# Patient Record
Sex: Female | Born: 1950
Health system: Southern US, Community
[De-identification: ages and names within clinical notes are randomized; demographics above are authoritative.]

## PROBLEM LIST (undated history)

## (undated) DIAGNOSIS — N289 Disorder of kidney and ureter, unspecified: Secondary | ICD-10-CM

## (undated) DIAGNOSIS — E119 Type 2 diabetes mellitus without complications: Secondary | ICD-10-CM

## (undated) DIAGNOSIS — I10 Essential (primary) hypertension: Secondary | ICD-10-CM

## (undated) HISTORY — PX: VAGINAL HYSTERECTOMY: SUR661

## (undated) HISTORY — PX: RENAL ARTERY ANGIOPLASTY: SHX2317

## (undated) HISTORY — PX: COLONOSCOPY: SHX174

## (undated) HISTORY — DX: Type 2 diabetes mellitus without complications: E11.9

---

## 2002-05-24 ENCOUNTER — Ambulatory Visit (HOSPITAL_COMMUNITY): Admission: RE | Admit: 2002-05-24 | Discharge: 2002-05-24 | Payer: Self-pay | Admitting: Family Medicine

## 2002-05-24 ENCOUNTER — Encounter: Payer: Self-pay | Admitting: Family Medicine

## 2006-12-25 ENCOUNTER — Encounter: Admission: RE | Admit: 2006-12-25 | Discharge: 2006-12-25 | Payer: Self-pay | Admitting: Cardiology

## 2006-12-29 ENCOUNTER — Ambulatory Visit (HOSPITAL_COMMUNITY): Admission: RE | Admit: 2006-12-29 | Discharge: 2006-12-29 | Payer: Self-pay | Admitting: Cardiology

## 2008-11-29 ENCOUNTER — Ambulatory Visit (HOSPITAL_COMMUNITY): Admission: RE | Admit: 2008-11-29 | Discharge: 2008-11-29 | Payer: Self-pay | Admitting: Family Medicine

## 2008-12-05 ENCOUNTER — Encounter: Admission: RE | Admit: 2008-12-05 | Discharge: 2008-12-05 | Payer: Self-pay | Admitting: Family Medicine

## 2009-05-01 ENCOUNTER — Other Ambulatory Visit: Admission: RE | Admit: 2009-05-01 | Discharge: 2009-05-01 | Payer: Self-pay | Admitting: Family Medicine

## 2009-07-26 ENCOUNTER — Encounter: Admission: RE | Admit: 2009-07-26 | Discharge: 2009-07-26 | Payer: Self-pay

## 2010-02-05 ENCOUNTER — Encounter
Admission: RE | Admit: 2010-02-05 | Discharge: 2010-02-05 | Payer: Self-pay | Source: Home / Self Care | Attending: Family Medicine | Admitting: Family Medicine

## 2010-03-17 ENCOUNTER — Encounter: Payer: Self-pay | Admitting: Obstetrics & Gynecology

## 2010-03-19 ENCOUNTER — Encounter: Payer: Self-pay | Admitting: Family Medicine

## 2010-07-10 NOTE — Cardiovascular Report (Signed)
Nicole Best, [place]              ACCOUNT NO.:  [id number]   MEDICAL RECORD NO.:  [id number]          PATIENT TYPE:  AMB   LOCATION:  SDS                          FACILITY:  [facility]   PHYSICIAN:  Vonna Kotyk R. Jacinto Halim, MD       DATE OF BIRTH:  07-Aug-1950   DATE OF PROCEDURE:  12/29/2006  DATE OF DISCHARGE:                            CARDIAC CATHETERIZATION   PROCEDURE PERFORMED:  1. Abdominal aortogram.  2. Selective right and left renal arteriography.   INDICATIONS:  Ms. [name] is a 60 year old female with difficult-to-  control hypertension who had an MRA of the renal arteries sometime in  2004 or 2005 and was found to have mild fibromuscular dysplasia.  She  came to see me about a week ago with a diastolic blood pressure of 140  mmHg.  Given the significant elevated blood pressure, she was brought to  the catheterization lab to evaluate her renal anatomy for possible  balloon angioplasty.   ANGIOGRAPHIC DATA:  There were two renal arteries one on either side.  The right renal artery appeared to be tortuous.   Selective right renal arteriography revealed a tortuous superior pole  branch of the right renal artery without any evidence of fibromuscular  dysplasia.  The renal artery was widely patent.   The left renal artery was also widely patent without any evidence of  renal artery stenosis.  There was excellent blush of parenchyma .   IMPRESSION:  No evidence of renal artery stenosis.   RECOMMENDATIONS:  The patient appears to have stabilized with the  present medical therapy, and her diastolic blood pressure was 94 mmHg  during the procedure.  We will continue to titrate the blood pressure  medications.   A total of 80 cc of contrast was utilized for diagnostic angiography.   PROCEDURE:  Under usual sterile precautions using a 6-French right  femoral arterial access, a 5-French short LIMA catheter was advanced to  the abdominal aorta and abdominal aortogram was performed.  The  catheter  was then utilized to engage selectively the right renal artery and then  the left renal artery and angiography was  performed.  The catheter was then pulled out of the body and right  femoral arteriography was performed through the arterial access sheath  and the access closed with StarClose with excellent hemostasis.  The  patient tolerated the procedure well.  No immediate complications noted.      Cristy Hilts. Jacinto Halim, MD  Electronically Signed     JRG/MEDQ  D:  12/29/2006  T:  12/30/2006  Job:  [id number]   cc:   Vikki Ports, M.D.

## 2011-02-15 ENCOUNTER — Other Ambulatory Visit (HOSPITAL_COMMUNITY): Payer: Self-pay | Admitting: Family Medicine

## 2011-02-15 DIAGNOSIS — Z1231 Encounter for screening mammogram for malignant neoplasm of breast: Secondary | ICD-10-CM

## 2011-03-19 ENCOUNTER — Other Ambulatory Visit (HOSPITAL_COMMUNITY): Payer: Self-pay | Admitting: Family Medicine

## 2011-03-21 ENCOUNTER — Ambulatory Visit (HOSPITAL_COMMUNITY): Payer: Self-pay

## 2011-04-10 ENCOUNTER — Ambulatory Visit
Admission: RE | Admit: 2011-04-10 | Discharge: 2011-04-10 | Disposition: A | Discharge status: Home / Self Care | Payer: BC Managed Care – PPO | Source: Ambulatory Visit | Attending: Family Medicine | Admitting: Family Medicine

## 2011-04-10 DIAGNOSIS — Z1231 Encounter for screening mammogram for malignant neoplasm of breast: Secondary | ICD-10-CM

## 2012-02-06 ENCOUNTER — Other Ambulatory Visit: Payer: Self-pay | Admitting: Family Medicine

## 2012-02-06 DIAGNOSIS — Z1231 Encounter for screening mammogram for malignant neoplasm of breast: Secondary | ICD-10-CM

## 2012-04-14 ENCOUNTER — Ambulatory Visit: Payer: BC Managed Care – PPO

## 2012-05-12 ENCOUNTER — Ambulatory Visit: Payer: BC Managed Care – PPO

## 2012-06-11 ENCOUNTER — Ambulatory Visit
Admission: RE | Admit: 2012-06-11 | Discharge: 2012-06-11 | Disposition: A | Discharge status: Home / Self Care | Payer: BC Managed Care – PPO | Source: Ambulatory Visit | Attending: Family Medicine | Admitting: Family Medicine

## 2012-06-11 DIAGNOSIS — Z1231 Encounter for screening mammogram for malignant neoplasm of breast: Secondary | ICD-10-CM

## 2012-07-28 ENCOUNTER — Other Ambulatory Visit (HOSPITAL_COMMUNITY)
Admission: RE | Admit: 2012-07-28 | Discharge: 2012-07-28 | Disposition: A | Discharge status: Home / Self Care | Payer: BC Managed Care – PPO | Source: Ambulatory Visit | Attending: Family Medicine | Admitting: Family Medicine

## 2012-07-28 ENCOUNTER — Other Ambulatory Visit: Payer: Self-pay | Admitting: Family Medicine

## 2012-07-28 DIAGNOSIS — Z124 Encounter for screening for malignant neoplasm of cervix: Secondary | ICD-10-CM | POA: Insufficient documentation

## 2013-03-24 ENCOUNTER — Other Ambulatory Visit: Payer: Self-pay

## 2013-03-24 DIAGNOSIS — Z1231 Encounter for screening mammogram for malignant neoplasm of breast: Secondary | ICD-10-CM

## 2013-06-15 ENCOUNTER — Ambulatory Visit: Payer: BC Managed Care – PPO

## 2013-07-08 ENCOUNTER — Ambulatory Visit: Payer: BC Managed Care – PPO

## 2013-07-13 ENCOUNTER — Ambulatory Visit: Payer: BC Managed Care – PPO

## 2013-07-15 ENCOUNTER — Ambulatory Visit
Admission: RE | Admit: 2013-07-15 | Discharge: 2013-07-15 | Disposition: A | Discharge status: Home / Self Care | Payer: 59 | Source: Ambulatory Visit

## 2013-07-15 DIAGNOSIS — Z1231 Encounter for screening mammogram for malignant neoplasm of breast: Secondary | ICD-10-CM

## 2014-02-28 ENCOUNTER — Emergency Department (HOSPITAL_COMMUNITY): Payer: Commercial Managed Care - PPO

## 2014-02-28 ENCOUNTER — Inpatient Hospital Stay (HOSPITAL_COMMUNITY)
Admission: EM | Admit: 2014-02-28 | Discharge: 2014-03-06 | DRG: 304 | Disposition: A | Discharge status: Home / Self Care | Payer: Commercial Managed Care - PPO | Attending: Internal Medicine | Admitting: Internal Medicine

## 2014-02-28 ENCOUNTER — Encounter (HOSPITAL_COMMUNITY): Payer: Self-pay | Admitting: Emergency Medicine

## 2014-02-28 DIAGNOSIS — Z8673 Personal history of transient ischemic attack (TIA), and cerebral infarction without residual deficits: Secondary | ICD-10-CM

## 2014-02-28 DIAGNOSIS — I161 Hypertensive emergency: Secondary | ICD-10-CM | POA: Diagnosis present

## 2014-02-28 DIAGNOSIS — F17210 Nicotine dependence, cigarettes, uncomplicated: Secondary | ICD-10-CM | POA: Diagnosis present

## 2014-02-28 DIAGNOSIS — Z7982 Long term (current) use of aspirin: Secondary | ICD-10-CM

## 2014-02-28 DIAGNOSIS — Z79899 Other long term (current) drug therapy: Secondary | ICD-10-CM | POA: Diagnosis not present

## 2014-02-28 DIAGNOSIS — I10 Essential (primary) hypertension: Principal | ICD-10-CM

## 2014-02-28 DIAGNOSIS — I160 Hypertensive urgency: Secondary | ICD-10-CM

## 2014-02-28 DIAGNOSIS — H538 Other visual disturbances: Secondary | ICD-10-CM | POA: Diagnosis present

## 2014-02-28 DIAGNOSIS — I672 Cerebral atherosclerosis: Secondary | ICD-10-CM | POA: Diagnosis present

## 2014-02-28 DIAGNOSIS — R42 Dizziness and giddiness: Secondary | ICD-10-CM

## 2014-02-28 DIAGNOSIS — E871 Hypo-osmolality and hyponatremia: Secondary | ICD-10-CM | POA: Diagnosis present

## 2014-02-28 DIAGNOSIS — I7771 Dissection of carotid artery: Secondary | ICD-10-CM | POA: Diagnosis present

## 2014-02-28 DIAGNOSIS — Z90710 Acquired absence of both cervix and uterus: Secondary | ICD-10-CM | POA: Diagnosis not present

## 2014-02-28 DIAGNOSIS — I701 Atherosclerosis of renal artery: Secondary | ICD-10-CM

## 2014-02-28 HISTORY — DX: Disorder of kidney and ureter, unspecified: N28.9

## 2014-02-28 HISTORY — DX: Essential (primary) hypertension: I10

## 2014-02-28 LAB — CBC
HCT: 40.2 % (ref 36.0–46.0)
HEMOGLOBIN: 13.2 g/dL (ref 12.0–15.0)
MCH: 29.1 pg (ref 26.0–34.0)
MCHC: 32.8 g/dL (ref 30.0–36.0)
MCV: 88.5 fL (ref 78.0–100.0)
PLATELETS: 358 10*3/uL (ref 150–400)
RBC: 4.54 MIL/uL (ref 3.87–5.11)
RDW: 13.4 % (ref 11.5–15.5)
WBC: 8.1 10*3/uL (ref 4.0–10.5)

## 2014-02-28 LAB — BASIC METABOLIC PANEL
ANION GAP: 10 (ref 5–15)
BUN: 10 mg/dL (ref 6–23)
CHLORIDE: 104 meq/L (ref 96–112)
CO2: 23 mmol/L (ref 19–32)
Calcium: 9.6 mg/dL (ref 8.4–10.5)
Creatinine, Ser: 0.82 mg/dL (ref 0.50–1.10)
GFR, EST AFRICAN AMERICAN: 88 mL/min — AB (ref >90)
GFR, EST NON AFRICAN AMERICAN: 76 mL/min — AB (ref >90)
GLUCOSE: 97 mg/dL (ref 70–99)
POTASSIUM: 4.2 mmol/L (ref 3.5–5.1)
SODIUM: 137 mmol/L (ref 135–145)

## 2014-02-28 LAB — GLUCOSE, CAPILLARY: GLUCOSE-CAPILLARY: 119 mg/dL — AB (ref 70–99)

## 2014-02-28 LAB — I-STAT TROPONIN, ED: Troponin i, poc: 0.01 ng/mL (ref 0.00–0.08)

## 2014-02-28 LAB — RAPID URINE DRUG SCREEN, HOSP PERFORMED
Amphetamines: NOT DETECTED
BARBITURATES: NOT DETECTED
Benzodiazepines: NOT DETECTED
Cocaine: NOT DETECTED
Opiates: NOT DETECTED
Tetrahydrocannabinol: NOT DETECTED

## 2014-02-28 LAB — APTT: APTT: 30 s (ref 24–37)

## 2014-02-28 MED ORDER — NICARDIPINE HCL IN NACL 20-0.86 MG/200ML-% IV SOLN
3.0000 mg/h | Freq: Once | INTRAVENOUS | Status: AC
  Administered 2014-02-28: 5 mg/h via INTRAVENOUS
  Filled 2014-02-28: qty 200

## 2014-02-28 MED ORDER — NICARDIPINE HCL IN NACL 20-0.86 MG/200ML-% IV SOLN
3.0000 mg/h | Freq: Once | INTRAVENOUS | Status: AC
  Administered 2014-02-28 (×2): 5 mg/h via INTRAVENOUS
  Filled 2014-02-28: qty 200

## 2014-02-28 MED ORDER — ACETAMINOPHEN 325 MG PO TABS: 650.0000 mg | ORAL_TABLET | Freq: Four times a day (QID) | ORAL | Status: DC | PRN

## 2014-02-28 MED ORDER — ESMOLOL HCL-SODIUM CHLORIDE 2000 MG/100ML IV SOLN
25.0000 ug/kg/min | Freq: Once | INTRAVENOUS | Status: AC
Start: 2014-02-28 — End: 2014-02-28
  Administered 2014-02-28: 25 ug/kg/min via INTRAVENOUS
  Filled 2014-02-28 (×2): qty 100

## 2014-02-28 MED ORDER — ENOXAPARIN SODIUM 40 MG/0.4ML ~~LOC~~ SOLN: 40.0000 mg | SUBCUTANEOUS | Status: DC

## 2014-02-28 MED ORDER — IOHEXOL 350 MG/ML SOLN
50.0000 mL | Freq: Once | INTRAVENOUS | Status: AC | PRN
  Administered 2014-02-28: 50 mL via INTRAVENOUS

## 2014-02-28 MED ORDER — ENOXAPARIN SODIUM 40 MG/0.4ML ~~LOC~~ SOLN
40.0000 mg | Freq: Every day | SUBCUTANEOUS | Status: DC
  Administered 2014-02-28 – 2014-03-05 (×6): 40 mg via SUBCUTANEOUS
  Filled 2014-02-28 (×7): qty 0.4

## 2014-02-28 NOTE — ED Notes (Signed)
Patient transported to CT 

## 2014-02-28 NOTE — H&P (Signed)
PULMONARY / CRITICAL CARE MEDICINE   Name: Nicole Best MRN: [id number] DOB: 1950/05/24    ADMISSION DATE:  02/28/2014 CONSULTATION DATE:  02/28/2014  REFERRING MD :  ED - Jeraldine Loots, MD  CHIEF COMPLAINT:  HTN emergency  INITIAL PRESENTATION: 64 year old woman with history of HTN, renal artery stenosis s/p R renal angioplasty 5-6 years ago presenting with LH/dizziness found to have BP 188/113 at PCP office. Started on esmolol gtt in ED.   STUDIES:  CT Head w/o 02/28/2014 >> Smooth enlargement of the L carotid canal and surrounding osseous structures with dehiscence of the posterior wall sphenoid on the L. No definite acute intracranial findings. CTA Head and Neck 02/28/2014 >> 50-60% stenosis of the left vertebral artery at the foramen magnum. Extensive atherosclerotic calcification in both carotid siphon regions. On the right, there is a 3 mm medially projecting atherosclerotic aneurysm. Stenosis in the distal siphon is estimated at 70% on the right. Maximal stenosis the proximal siphon on the left is estimated at 50%. Enlargement of the distal carotid canal on the left and the left cavernous sinus region.  SIGNIFICANT EVENTS: 1/4: Started on esmolol gtt in ED, switched to cardene gtt.  HISTORY OF PRESENT ILLNESS:  64 year old woman with history of HTN, renal artery stenosis s/p R renal angioplasty 5-6 years ago presenting with LH/dizziness found to have BP 188/113 at PCP office. Her dizziness started this morning around 3AM upon standing from supine position. She sat back down which helped relieve the dizziness. Upon standing again she felt lightheaded. Denies changes in diet, weight change, vision changes, chest pain, SOB, N/V, abdominal pain, diarrhea, dysuria, paresthesias, weakness, headache. She checks her BP 2-3x/week and last checked Saturday was around her baseline - 152/90. Reports compliance of her clonidine 0.3mg  BID and lisinopril 2.5mg  daily. She went to her PCP office and was found to  be hypertensive to 188/113. She was transferred to the ED. Upon presentation to the ED, she was found to have BP 215/149. Started on esmolol gtt in ED.   MRA abdomen 05/25/2002 with weblike narrowing in distal right main renal artery suggestive of fibromuscular dysplasia. Abdominal aortogram and selective R and L renal arteriography 12/29/2006 by Dr. Jacinto Halim with no evidence of renal artery stenosis.  PAST MEDICAL HISTORY :   has a past medical history of Hypertension and Renal disorder.  has past surgical history that includes Colonoscopy; Renal artery angioplasty; and Vaginal hysterectomy.   Prior to Admission medications   Medication Sig Start Date End Date Taking? Authorizing Provider  aspirin 325 MG tablet Take 325 mg by mouth See admin instructions. Only take twice a week no specific days   Yes Historical Provider, MD  cloNIDine (CATAPRES) 0.3 MG tablet Take 0.3 mg by mouth 2 (two) times daily.   Yes Historical Provider, MD  fluticasone (FLONASE) 50 MCG/ACT nasal spray Place 1 spray into both nostrils daily as needed for allergies or rhinitis.   Yes Historical Provider, MD  lisinopril (PRINIVIL,ZESTRIL) 2.5 MG tablet Take 2.5 mg by mouth daily.   Yes Historical Provider, MD  lisinopril (PRINIVIL,ZESTRIL) 20 MG tablet Take 20 mg by mouth daily.   Yes Historical Provider, MD   No Known Allergies  FAMILY HISTORY:  has no family status information on file.    SOCIAL HISTORY:  reports that she has been smoking Cigarettes.  She has been smoking about 0.50 packs per day. She does not have any smokeless tobacco history on file. She reports that she does not drink alcohol or  use illicit drugs.  REVIEW OF SYSTEMS:   Denies fevers/chills, changes in diet, weight change, vision changes, chest pain, SOB, N/V, abdominal pain, diarrhea, dysuria, paresthesias, weakness, headache.  SUBJECTIVE: Awake and alert sitting in bed  VITAL SIGNS: Temp:  [98 F (36.7 C)] 98 F (36.7 C) (01/04 1529) Pulse  Rate:  [60-81] 68 (01/04 1845) Resp:  [10-24] 10 (01/04 1845) BP: (203-236)/(125-149) 221/135 mmHg (01/04 1845) SpO2:  [94 %-100 %] 94 % (01/04 1845) Weight:  [150 lb (68.04 kg)] 150 lb (68.04 kg) (01/04 1529)   HEMODYNAMICS:     VENTILATOR SETTINGS:     INTAKE / OUTPUT: No intake or output data in the 24 hours ending 02/28/14 1929  PHYSICAL EXAMINATION: General:  NAD Neuro:  No gross deficits HEENT:  [place]/AT, PERRL Cardiovascular:  RRR, no m/r/g Lungs:  CTAB, no w/r/r Abdomen:  S, NT, ND Musculoskeletal:  No LE edema Skin:  Warm and well perfused  LABS:  CBC  Recent Labs Lab 02/28/14 1623  WBC 8.1  HGB 13.2  HCT 40.2  PLT 358   Coag's No results for input(s): APTT, INR in the last 168 hours.   BMET  Recent Labs Lab 02/28/14 1623  NA 137  K 4.2  CL 104  CO2 23  BUN 10  CREATININE 0.82  GLUCOSE 97   Electrolytes  Recent Labs Lab 02/28/14 1623  CALCIUM 9.6   Sepsis Markers No results for input(s): LATICACIDVEN, PROCALCITON, O2SATVEN in the last 168 hours.   ABG No results for input(s): PHART, PCO2ART, PO2ART in the last 168 hours.   Liver Enzymes No results for input(s): AST, ALT, ALKPHOS, BILITOT, ALBUMIN in the last 168 hours.   Cardiac Enzymes No results for input(s): TROPONINI, PROBNP in the last 168 hours.   Glucose No results for input(s): GLUCAP in the last 168 hours.  Imaging No results found.  CXR 02/28/2014 - no acute disease  CT Head w/o 02/28/2014 - Smooth enlargement of the L carotid canal and surrounding osseous structures with dehiscence of the posterior wall sphenoid on the L. No definite acute intracranial findings.  CTA Head and Neck 02/28/2014 - 50-60% stenosis of the left vertebral artery at the foramen magnum. Extensive atherosclerotic calcification in both carotid siphon regions. On the right, there is a 3 mm medially projecting atherosclerotic aneurysm. Stenosis in the distal siphon is estimated at 70% on the right.  Maximal stenosis the proximal siphon on the left is estimated at 50%. Enlargement of the distal carotid canal on the left and the left cavernous sinus region.  ASSESSMENT / PLAN:  PULMONARY OETT - none A: No acute issues P:  Continue to monitor  CARDIOVASCULAR CVL - none A: Hypertensive Emergency P:  Cardene gtt - titrate to SBP 180 UDS pending Discussed with Cardiology (Dr. Jacinto Halim) - consider hydralazine prn, NPO after MN  RENAL A: Hx renal artery stenosis P:   Renal artery duplex pending  GASTROINTESTINAL A: No acute issues P:   Low sodium diet, NPO after MN  HEMATOLOGIC A:  VTE ppx P:  lovenox  daily  INFECTIOUS A:  No acute issues P:  Continue to monitor  ENDOCRINE A:  No acute issues  P:  Continue to monitor   NEUROLOGIC A:  No acute issues P:  Continue to monitor   FAMILY  - Updates: No family present at bedside  - Inter-disciplinary family meet or Palliative Care meeting due by:  03/07/2014    TODAY'S SUMMARY: Switched from esmolol gtt to  Cardene gtt. Consulted cardiology (Dr. Jacinto Halim).  Discussed with sr. resident, Dr. Zada Girt who discussed with attending Dr. Grayland Jack, MD  Pulmonary and Critical Care Medicine [place] Pager: [phone]  02/28/2014, 7:29 PM

## 2014-02-28 NOTE — ED Notes (Signed)
Attempt to call report.

## 2014-02-28 NOTE — ED Notes (Signed)
Radiology at bedside

## 2014-02-28 NOTE — ED Notes (Signed)
Attempted to call report

## 2014-02-28 NOTE — ED Provider Notes (Signed)
I discussed patient's case with our radiologist after completion of the CT angiography. Results are abnormal, but seem subacute versus chronic. On repeat exam the patient is awake and alert, but remains hypertensive, in spite of near maximum dose of esmolol drip. Patient will receive a second IV drip medication for blood pressure control. Given the patient's need for multiple IV drip medications, I discussed her case with our critical care colleagues, who will assist with her evaluation.   Gerhard Munch, MD 02/28/14 1859

## 2014-02-28 NOTE — ED Notes (Signed)
Per EMS, pt comes from [facility] with c/o dizziness/lightheadness and increasing BP. Pt denies SOB or chest pain. Pt has h/o hypertension and renal artery stenosis bleed. Pt A&OX4, NAD noted. Pt ambulated off ems stretcher. BP 226/160,P78. IV placed, no meds given.

## 2014-02-28 NOTE — ED Notes (Signed)
Dow Adolph MD wanted to DC esmolol.

## 2014-02-28 NOTE — ED Provider Notes (Signed)
CSN: [id number]     Arrival date & time 02/28/14  1515 History   First MD Initiated Contact with Patient 02/28/14 1515     No chief complaint on file.    (Consider location/radiation/quality/duration/timing/severity/associated sxs/prior Treatment) HPI Comments: Has been checking BPs, normally systolics int he 180s. Went to urgent care for dizziness, BP found to be in the 230s systolic.  Hx of renal artery stenosis, had angioplasty for correction previously.  Patient is a 64 y.o. female presenting with hypertension. The history is provided by the patient.  Hypertension This is a new problem. The current episode started 6 to 12 hours ago. The problem occurs constantly. The problem has been rapidly worsening. Pertinent negatives include no chest pain, no abdominal pain and no shortness of breath. Nothing aggravates the symptoms. Nothing relieves the symptoms.    Past Medical History  Diagnosis Date  . Hypertension   . Renal disorder    Past Surgical History  Procedure Laterality Date  . Colonoscopy    . Renal artery angioplasty    . Vaginal hysterectomy     No family history on file. History  Substance Use Topics  . Smoking status: Current Every Day Smoker -- 0.50 packs/day    Types: Cigarettes  . Smokeless tobacco: Not on file  . Alcohol Use: No   OB History    No data available     Review of Systems  Constitutional: Negative for fever.  Respiratory: Negative for cough and shortness of breath.   Cardiovascular: Negative for chest pain.  Gastrointestinal: Negative for vomiting and abdominal pain.  All other systems reviewed and are negative.     Allergies  Review of patient's allergies indicates not on file.  Home Medications   Prior to Admission medications   Not on File   There were no vitals taken for this visit. Physical Exam  Constitutional: She is oriented to person, place, and time. She appears well-developed and well-nourished. No distress.  HENT:   Head: Normocephalic and atraumatic.  Mouth/Throat: Oropharynx is clear and moist.  Eyes: EOM are normal. Pupils are equal, round, and reactive to light.  Neck: Normal range of motion. Neck supple.  Cardiovascular: Normal rate and regular rhythm.  Exam reveals no friction rub.   No murmur heard. Pulmonary/Chest: Effort normal and breath sounds normal. No respiratory distress. She has no wheezes. She has no rales.  Abdominal: Soft. She exhibits no distension. There is no tenderness. There is no rebound.  Musculoskeletal: Normal range of motion. She exhibits no edema.  Neurological: She is alert and oriented to person, place, and time. No cranial nerve deficit. She exhibits normal muscle tone. Coordination normal.  Skin: No rash noted. She is not diaphoretic.  Nursing note and vitals reviewed.   ED Course  Procedures (including critical care time) Labs Review Labs Reviewed - No data to display  Imaging Review Ct Head Wo Contrast  02/28/2014   CLINICAL DATA:  Dizziness and lightheadedness. This began earlier today. Initial encounter.  EXAM: CT HEAD WITHOUT CONTRAST  TECHNIQUE: Contiguous axial images were obtained from the base of the skull through the vertex without intravenous contrast.  COMPARISON:  None.  FINDINGS: Dolichoectatic and calcified cerebral vasculature, particularly the vertebrobasilar system where there is calcification involving the dominant LEFT vertebral as well as the mid basilar artery. Carotid siphons are also heavily calcified.  There is smooth enlargement of the LEFT carotid canal and surrounding osseous structures, including the posterior wall of the LEFT sphenoid sinus and  medial sella. It is unclear if this is due to dolichoectasia (more likely) or carotid aneurysm. Consider CTA head/neck for further evaluation if clinically indicated.  No evidence for acute infarction, hemorrhage, mass lesion, hydrocephalus, or extra-axial fluid. Normal cerebral volume. Slight  hypoattenuation of white matter, could represent early small vessel disease.  The calvarium is intact. There is no acute sinus or mastoid disease.  IMPRESSION: Smooth enlargement of the LEFT carotid canal and surrounding osseous structures with dehiscence of the posterior wall sphenoid on the LEFT. Likely extreme dolichoectasia but carotid aneurysm not excluded. Consider CTA head/neck for further evaluation. See discussion above  Posterior circulation also calcified and significantly dolichoectatic.  No definite acute intracranial findings. Normal cerebral volume with hypoattenuation white matter, likely chronic microvascular ischemic change. No evidence for cortical infarction or CT signs of proximal vascular thrombosis.   Electronically Signed   By: Davonna Belling M.D.   On: 02/28/2014 16:12   Dg Chest Portable 1 View  02/28/2014   CLINICAL DATA:  Hypertension for 1 day.  EXAM: PORTABLE CHEST - 1 VIEW  COMPARISON:  PA and lateral chest 12/25/2006.  FINDINGS: Heart size is upper normal. The lungs are clear. There is no pneumothorax or pleural effusion.  IMPRESSION: No acute disease.   Electronically Signed   By: Drusilla Kanner M.D.   On: 02/28/2014 15:51     EKG Interpretation   Date/Time:  Monday February 28 2014 15:25:53 EST Ventricular Rate:  85 PR Interval:  179 QRS Duration: 88 QT Interval:  411 QTC Calculation: 489 R Axis:   36 Text Interpretation:  Sinus rhythm LAE, consider biatrial enlargement  Anteroseptal infarct, age indeterminate No prior for comparison Confirmed  by [facility]  MD, Kieran Nachtigal [phone]) on 02/28/2014 3:33:02 PM     CRITICAL CARE Performed by: Dagmar Hait   Total critical care time: 30 minutes  Critical care time was exclusive of separately billable procedures and treating other patients.  Critical care was necessary to treat or prevent imminent or life-threatening deterioration.  Critical care was time spent personally by me on the following activities:  development of treatment plan with patient and/or surrogate as well as nursing, discussions with consultants, evaluation of patient's response to treatment, examination of patient, obtaining history from patient or surrogate, ordering and performing treatments and interventions, ordering and review of laboratory studies, ordering and review of radiographic studies, pulse oximetry and re-evaluation of patient's condition.  MDM   Final diagnoses:  Hypertensive urgency  Dizziness    35F here with dizziness. Hx of renal artery stenosis requiring angioplasty. Awoke with dizziness, persistent. Initially had blurry vision which is improving. No CP, SOB, N/V. No gait abnoramlities. Afebrile here, markedly hypertensive. Will start on esmolol drip for easy BP titration, check labs. CT head concerning for possible carotid dissection, will obtain CTA of the neck.   With esmolol, BPs improving, dizziness improving.   Dr. Jeraldine Loots to f/u on labs and assist with admission.    Elwin Mocha, MD 02/28/14 [phone]

## 2014-03-01 ENCOUNTER — Encounter (HOSPITAL_COMMUNITY): Payer: Self-pay | Admitting: *Deleted

## 2014-03-01 ENCOUNTER — Inpatient Hospital Stay (HOSPITAL_COMMUNITY): Payer: Commercial Managed Care - PPO

## 2014-03-01 ENCOUNTER — Encounter (HOSPITAL_COMMUNITY)
Admission: EM | Disposition: A | Discharge status: Home / Self Care | Payer: Self-pay | Source: Home / Self Care | Attending: Pulmonary Disease

## 2014-03-01 DIAGNOSIS — I160 Hypertensive urgency: Secondary | ICD-10-CM | POA: Insufficient documentation

## 2014-03-01 DIAGNOSIS — I10 Essential (primary) hypertension: Secondary | ICD-10-CM

## 2014-03-01 DIAGNOSIS — I517 Cardiomegaly: Secondary | ICD-10-CM

## 2014-03-01 LAB — CBC
HCT: 41.5 % (ref 36.0–46.0)
HEMOGLOBIN: 13.9 g/dL (ref 12.0–15.0)
MCH: 29.5 pg (ref 26.0–34.0)
MCHC: 33.5 g/dL (ref 30.0–36.0)
MCV: 88.1 fL (ref 78.0–100.0)
Platelets: 318 10*3/uL (ref 150–400)
RBC: 4.71 MIL/uL (ref 3.87–5.11)
RDW: 13.5 % (ref 11.5–15.5)
WBC: 10.1 10*3/uL (ref 4.0–10.5)

## 2014-03-01 LAB — PROTIME-INR
INR: 0.99 (ref 0.00–1.49)
Prothrombin Time: 13.2 seconds (ref 11.6–15.2)

## 2014-03-01 LAB — BASIC METABOLIC PANEL
Anion gap: 9 (ref 5–15)
BUN: 8 mg/dL (ref 6–23)
CHLORIDE: 104 meq/L (ref 96–112)
CO2: 27 mmol/L (ref 19–32)
Calcium: 9.8 mg/dL (ref 8.4–10.5)
Creatinine, Ser: 0.85 mg/dL (ref 0.50–1.10)
GFR calc non Af Amer: 72 mL/min — ABNORMAL LOW (ref >90)
GFR, EST AFRICAN AMERICAN: 84 mL/min — AB (ref >90)
Glucose, Bld: 100 mg/dL — ABNORMAL HIGH (ref 70–99)
POTASSIUM: 3.9 mmol/L (ref 3.5–5.1)
Sodium: 140 mmol/L (ref 135–145)

## 2014-03-01 LAB — MRSA PCR SCREENING: MRSA BY PCR: NEGATIVE

## 2014-03-01 SURGERY — RENAL ANGIOGRAM

## 2014-03-01 MED ORDER — AMIODARONE HCL IN DEXTROSE 360-4.14 MG/200ML-% IV SOLN: 60.0000 mg/h | INTRAVENOUS | Status: DC

## 2014-03-01 MED ORDER — ASPIRIN EC 81 MG PO TBEC
81.0000 mg | DELAYED_RELEASE_TABLET | Freq: Every day | ORAL | Status: DC
  Administered 2014-03-01: 81 mg via ORAL
  Filled 2014-03-01 (×2): qty 1

## 2014-03-01 MED ORDER — LABETALOL HCL 5 MG/ML IV SOLN
5.0000 mg | INTRAVENOUS | Status: DC | PRN
Start: 2014-03-01 — End: 2014-03-06
  Administered 2014-03-01: 5 mg via INTRAVENOUS
  Filled 2014-03-01 (×2): qty 4

## 2014-03-01 MED ORDER — CLONIDINE HCL 0.3 MG PO TABS
0.3000 mg | ORAL_TABLET | Freq: Two times a day (BID) | ORAL | Status: DC
  Administered 2014-03-01 – 2014-03-03 (×6): 0.3 mg via ORAL
  Filled 2014-03-01 (×8): qty 1

## 2014-03-01 MED ORDER — GADOBENATE DIMEGLUMINE 529 MG/ML IV SOLN
15.0000 mL | Freq: Once | INTRAVENOUS | Status: AC | PRN
  Administered 2014-03-01: 15 mL via INTRAVENOUS

## 2014-03-01 MED ORDER — AMIODARONE HCL IN DEXTROSE 360-4.14 MG/200ML-% IV SOLN: 30.0000 mg/h | INTRAVENOUS | Status: DC

## 2014-03-01 MED ORDER — NICARDIPINE HCL IN NACL 20-0.86 MG/200ML-% IV SOLN
3.0000 mg/h | INTRAVENOUS | Status: DC
  Administered 2014-03-01 (×2): 5 mg/h via INTRAVENOUS
  Administered 2014-03-01: 3 mg/h via INTRAVENOUS
  Filled 2014-03-01 (×3): qty 200

## 2014-03-01 MED ORDER — AMIODARONE LOAD VIA INFUSION
150.0000 mg | Freq: Once | INTRAVENOUS | Status: DC
  Filled 2014-03-01: qty 83.34

## 2014-03-01 NOTE — Progress Notes (Signed)
PULMONARY / CRITICAL CARE MEDICINE   Name: Nicole Best MRN: [id number] DOB: [birth year]    ADMISSION DATE:  02/28/2014 CONSULTATION DATE:  02/28/2014  REFERRING MD :  ED - Jeraldine Loots, MD  CHIEF COMPLAINT:  HTN emergency  INITIAL PRESENTATION: 64 year old woman with history of HTN, renal artery stenosis s/p R renal angioplasty 5-6 years ago presenting with LH/dizziness found to have BP 188/113 at PCP office. Started on esmolol gtt in ED.   STUDIES:  CT Head w/o 02/28/2014 >> Smooth enlargement of the L carotid canal and surrounding osseous structures with dehiscence of the posterior wall sphenoid on the L. No definite acute intracranial findings. CTA Head and Neck 02/28/2014 >> 50-60% stenosis of the left vertebral artery at the foramen magnum. Extensive atherosclerotic calcification in both carotid siphon regions. On the right, there is a 3 mm medially projecting atherosclerotic aneurysm. Stenosis in the distal siphon is estimated at 70% on the right. Maximal stenosis the proximal siphon on the left is estimated at 50%. Enlargement of the distal carotid canal on the left and the left cavernous sinus region.  SIGNIFICANT EVENTS: 1/4>> Started on esmolol gtt in ED, switched to cardene gtt.   SUBJECTIVE: Awake and alert sitting in bed. Has no complaints.   VITAL SIGNS: Temp:  [98 F (36.7 C)-98.7 F (37.1 C)] 98.7 F (37.1 C) (01/05 0415) Pulse Rate:  [60-108] 94 (01/05 0700) Resp:  [10-24] 15 (01/05 0700) BP: (129-236)/(76-149) 133/82 mmHg (01/05 0700) SpO2:  [93 %-100 %] 95 % (01/05 0700) Weight:  [68.04 kg (150 lb)] 68.04 kg (150 lb) (01/04 1529)   HEMODYNAMICS:     VENTILATOR SETTINGS:     INTAKE / OUTPUT:  Intake/Output Summary (Last 24 hours) at 03/01/14 0735 Last data filed at 03/01/14 0700  Gross per 24 hour  Intake    374 ml  Output      0 ml  Net    374 ml    PHYSICAL EXAMINATION: General:  Alert, awake, NAD Neuro:  Alert and oriented x 3. No gross  deficits HEENT:  [place]/AT, PERRL, EOMI, mucus membranes moist, no neck bruit or fullness or tenderness, no headache Cardiovascular:  RRR, no m/r/g Lungs:  CTA bilaterally, no wheezing Abdomen:  BS+, soft, nontender Musculoskeletal:  No LE edema Skin:  Warm and well perfused  LABS:  CBC  Recent Labs Lab 02/28/14 1623 03/01/14 0227  WBC 8.1 10.1  HGB 13.2 13.9  HCT 40.2 41.5  PLT 358 318   Coag's  Recent Labs Lab 02/28/14 2308 03/01/14 0227  APTT 30  --   INR  --  0.99     BMET  Recent Labs Lab 02/28/14 1623 03/01/14 0227  NA 137 140  K 4.2 3.9  CL 104 104  CO2 23 27  BUN 10 8  CREATININE 0.82 0.85  GLUCOSE 97 100*   Electrolytes  Recent Labs Lab 02/28/14 1623 03/01/14 0227  CALCIUM 9.6 9.8   Sepsis Markers No results for input(s): LATICACIDVEN, PROCALCITON, O2SATVEN in the last 168 hours.   ABG No results for input(s): PHART, PCO2ART, PO2ART in the last 168 hours.   Liver Enzymes No results for input(s): AST, ALT, ALKPHOS, BILITOT, ALBUMIN in the last 168 hours.   Cardiac Enzymes No results for input(s): TROPONINI, PROBNP in the last 168 hours.   Glucose  Recent Labs Lab 02/28/14 2230  GLUCAP 119*    Imaging Ct Angio Head W/cm &/or Wo Cm  02/28/2014   CLINICAL DATA:  Hypertension.  Dizziness and lightheaded.  EXAM: CT ANGIOGRAPHY HEAD  TECHNIQUE: Multidetector CT imaging of the head was performed using the standard protocol during bolus administration of intravenous contrast. Multiplanar CT image reconstructions and MIPs were obtained to evaluate the vascular anatomy.  CONTRAST:  50mL OMNIPAQUE IOHEXOL 350 MG/ML SOLN  COMPARISON:  None.  FINDINGS: CT HEAD  No acute stroke is seen. There are areas of low density in the hemispheric white matter likely representing chronic small-vessel ischemic changes. No cortical stroke. No mass lesion, hemorrhage, hydrocephalus or extra-axial collection. No abnormal contrast enhancement seen.  CTA HEAD  Anterior  circulation: Both internal carotid arteries are widely patent through the skullbase. There is calcification of the carotid artery walls in the carotid siphon regions. There is no atherosclerotic 3 mm aneurysm projecting medially from the carotid siphon on the right. Stenosis in the distal siphon on the right approaches 70%. Both anterior and middle cerebral vessels are widely patent without stenosis, aneurysm or vascular malformation.  Posterior circulation:  The left vertebral artery is dominant. There is atherosclerotic calcification at the foramen magnum with narrowing of the left vertebral artery by 50-60%. The basilar artery shows mild atherosclerotic irregularity and is tortuous but there is no basilar stenosis. Posterior circulation branch vessels are patent. No posterior circulation aneurysm.  There is enlargement of the distal carotid canal on the left and enlargement of the cavernous sinus region, filled by a low density material (30 Hounsfield units). There is no sign of peripheral calcification to suggest pseudo aneurysm.  Venous sinuses: Widely patent  IMPRESSION: Advanced atherosclerotic vascular disease.  50-60% stenosis of the left vertebral artery at the foramen magnum. This is the dominant vessel.  Extensive atherosclerotic calcification in both carotid siphon regions. On the right, there is a 3 mm medially projecting atherosclerotic aneurysm. Stenosis in the distal siphon is estimated at 70% on the right. Maximal stenosis the proximal siphon on the left is estimated at 50%.  Enlargement of the distal carotid canal on the left and the left cavernous sinus region. I do not think this likely relates to a carotid aneurysm. Conceivably, chronic dissection with a chronically thrombosis pseudo aneurysm could give this appearance, but I do not favor that. This could be a congenital chronic finding. I considered the possibility of a schwannoma resulting in this appearance. I think MRI, intracranial MR  angiography, and postcontrast MRI will be necessary to fully understand this appearance.   Electronically Signed   By: Paulina FusiMark  Shogry M.D.   On: 02/28/2014 18:45   Ct Head Wo Contrast  02/28/2014   CLINICAL DATA:  Dizziness and lightheadedness. This began earlier today. Initial encounter.  EXAM: CT HEAD WITHOUT CONTRAST  TECHNIQUE: Contiguous axial images were obtained from the base of the skull through the vertex without intravenous contrast.  COMPARISON:  None.  FINDINGS: Dolichoectatic and calcified cerebral vasculature, particularly the vertebrobasilar system where there is calcification involving the dominant LEFT vertebral as well as the mid basilar artery. Carotid siphons are also heavily calcified.  There is smooth enlargement of the LEFT carotid canal and surrounding osseous structures, including the posterior wall of the LEFT sphenoid sinus and medial sella. It is unclear if this is due to dolichoectasia (more likely) or carotid aneurysm. Consider CTA head/neck for further evaluation if clinically indicated.  No evidence for acute infarction, hemorrhage, mass lesion, hydrocephalus, or extra-axial fluid. Normal cerebral volume. Slight hypoattenuation of white matter, could represent early small vessel disease.  The calvarium is intact. There is no acute  sinus or mastoid disease.  IMPRESSION: Smooth enlargement of the LEFT carotid canal and surrounding osseous structures with dehiscence of the posterior wall sphenoid on the LEFT. Likely extreme dolichoectasia but carotid aneurysm not excluded. Consider CTA head/neck for further evaluation. See discussion above  Posterior circulation also calcified and significantly dolichoectatic.  No definite acute intracranial findings. Normal cerebral volume with hypoattenuation white matter, likely chronic microvascular ischemic change. No evidence for cortical infarction or CT signs of proximal vascular thrombosis.   Electronically Signed   By: Davonna Belling M.D.   On:  02/28/2014 16:12   Dg Chest Portable 1 View  02/28/2014   CLINICAL DATA:  Hypertension for 1 day.  EXAM: PORTABLE CHEST - 1 VIEW  COMPARISON:  PA and lateral chest 12/25/2006.  FINDINGS: Heart size is upper normal. The lungs are clear. There is no pneumothorax or pleural effusion.  IMPRESSION: No acute disease.   Electronically Signed   By: Drusilla Kanner M.D.   On: 02/28/2014 15:51    CXR 02/28/2014 - no acute disease  CT Head w/o 02/28/2014 - Smooth enlargement of the L carotid canal and surrounding osseous structures with dehiscence of the posterior wall sphenoid on the L. No definite acute intracranial findings.  CTA Head and Neck 02/28/2014 - 50-60% stenosis of the left vertebral artery at the foramen magnum. Extensive atherosclerotic calcification in both carotid siphon regions. On the right, there is a 3 mm medially projecting atherosclerotic aneurysm. Stenosis in the distal siphon is estimated at 70% on the right. Maximal stenosis the proximal siphon on the left is estimated at 50%. Enlargement of the distal carotid canal on the left and the left cavernous sinus region.  ASSESSMENT / PLAN:  PULMONARY OETT - none A: No acute issues P:  Continue to monitor IS  CARDIOVASCULAR CVL - none A: Hypertensive Emergency R/o cardiomyopathy, rt heart enlarged on pcxr Ras ?  P:  Cardene gtt - titrate to 25% reduction from highest MAP  = 110-120 UDS negative Echo assessment consider oral additions Clonidine now, in am or afternoon if needed to dc drip add BB (labetolol) NO ACEI  RENAL A: Hx renal artery stenosis P:   Renal artery duplex pending Chem in am  kvo  GASTROINTESTINAL A: No acute issues P:   Low sodium diet  HEMATOLOGIC A:  VTE ppx P:  Lovenox  daily  INFECTIOUS A:  No acute issues P:  Continue to monitor  ENDOCRINE A:  No acute issues  P:  Continue to monitor  NEUROLOGIC A:  R/o carotid dissection, chronic calcified anuerysm P:  No symptoms to  support Neuro consult mra   FAMILY  - Updates: No family present at bedside  - Inter-disciplinary family meet or Palliative Care meeting due by:  03/07/2014    TODAY'S SUMMARY: On Cardene gtt. Cardiology (Dr. Jacinto Halim) following.  Rich Number, MD  Pulmonary and Critical Care Medicine [facility] Pager: [phone]  03/01/2014, 7:35 AM   STAFF NOTE: I, Rory Percy, MD FACP have personally reviewed patient's available data, including medical history, events of note, physical examination and test results as part of my evaluation. I have discussed with resident/NP and other care providers such as pharmacist, RN and RRT. In addition, I personally evaluated patient and elicited key findings of: no headache, no neck pain, no crackles, cont nicardipine to goals above calculated, add clonidine , will rebound without, MRA, neuro consult, echo, labetalol likely The patient is critically ill with multiple organ systems failure and requires  high complexity decision making for assessment and support, frequent evaluation and titration of therapies, application of advanced monitoring technologies and extensive interpretation of multiple databases.   Critical Care Time devoted to patient care services described in this note is30 Minutes. This time reflects time of care of this signee: Rory Percy, MD FACP. This critical care time does not reflect procedure time, or teaching time or supervisory time of PA/NP/Med student/Med Resident etc but could involve care discussion time. Rest per NP/medical resident whose note is outlined above and that I agree with   Mcarthur Rossetti. Tyson Alias, MD, FACP Pgr: [phone] [facility] 03/01/2014 9:38 AM

## 2014-03-01 NOTE — Progress Notes (Signed)
Patient BP MAP > 130 while beginging MRI.  5 mg Labetalol given @ 1840.  Patient BP decreased to MAP < 130 by 15 min post med administration.  B/P 1845 152/109 (126)

## 2014-03-01 NOTE — Consult Note (Signed)
CARDIOLOGY CONSULT NOTE  Patient ID: Nicole Best MRN: [id number] DOB/AGE: 1950-03-11 64 y.o.  Admit date: 02/28/2014 Referring Physician: EDP Primary Physician:  No primary care provider on file. Reason for Consultation: Malignant HTN  HPI: Patient is a 64 year old African-American female with a history of hypertension.  She presented to the urgent care on 02/28/2014 for dizziness since waking that morning and blurred vision..  Blood pressure was noted to be 230 systolic and she was sent to the emergency department for further evaluation and treatment.  She was started on esmolol drip in the emergency department with improvement in blood pressure and symptoms.  She was switched to nicardipine drip which she remains on currently.  Cardiology was consulted due to reported history of renal artery stenosis.    She underwent renal arteriography on 12/29/2006 due to persistent hypertension and MRA of renal arteries sometime in 2005 which showed mild fibromuscular dysplasia.  Renal arteriography revealed torturous right renal artery but no stenosis bilaterally.  Medication was titrated appropriately and her blood pressure improved.  She has not followed up since.  She denies any symptoms presently.  She denies any chest pain, shortness of breath, PND, orthopnea, edema, or symptoms suggestive of TIA.  CT angiogram of the head and neck performed on admission also had suggested possibility of a dissection, she has been scheduled for MRA and MRI of the brain. All her symptoms that she presented with has subsided, patient states that she's feeling well presently.  Past Medical History  Diagnosis Date  . Hypertension   . Renal disorder      Past Surgical History  Procedure Laterality Date  . Colonoscopy    . Renal artery angioplasty    . Vaginal hysterectomy       No family history on file.   Social History: History   Social History  . Marital Status: Widowed    Spouse Name: N/A    Number of  Children: N/A  . Years of Education: N/A   Occupational History  . Not on file.   Social History Main Topics  . Smoking status: Current Every Day Smoker -- 0.50 packs/day    Types: Cigarettes  . Smokeless tobacco: Not on file  . Alcohol Use: No  . Drug Use: No  . Sexual Activity: Not Currently   Other Topics Concern  . Not on file   Social History Narrative     Prescriptions prior to admission  Medication Sig Dispense Refill Last Dose  . aspirin 325 MG tablet Take 325 mg by mouth See admin instructions. Only take twice a week no specific days   Past Week at Unknown time  . cloNIDine (CATAPRES) 0.3 MG tablet Take 0.3 mg by mouth 2 (two) times daily.   02/28/2014 at Unknown time  . fluticasone (FLONASE) 50 MCG/ACT nasal spray Place 1 spray into both nostrils daily as needed for allergies or rhinitis.   Past Week at Unknown time  . lisinopril (PRINIVIL,ZESTRIL) 2.5 MG tablet Take 2.5 mg by mouth daily.   02/28/2014 at Unknown time  . lisinopril (PRINIVIL,ZESTRIL) 20 MG tablet Take 20 mg by mouth daily.   02/28/2014 at Unknown time    Scheduled Meds: . enoxaparin (LOVENOX) injection  40 mg Subcutaneous QHS   Continuous Infusions: . niCARDipine 4 mg/hr (03/01/14 0920)   PRN Meds:.acetaminophen  ROS: General: no fevers/chills/night sweats Eyes: no blurry vision, diplopia, or amaurosis ENT: no sore throat or hearing loss Resp: no cough, wheezing, or hemoptysis CV: no edema  or palpitations GI: no abdominal pain, nausea, vomiting, diarrhea, or constipation GU: no dysuria, frequency, or hematuria Skin: no rash Neuro: no headache, numbness, tingling, or weakness of extremities Musculoskeletal: no joint pain or swelling Heme: no bleeding, DVT, or easy bruising Endo: no polydipsia or polyuria    Physical Exam: Blood pressure 137/93, pulse 102, temperature 98.7 F (37.1 C), temperature source Oral, resp. rate 16, height 5\' 8"  (1.727 m), weight 68.04 kg (150 lb), SpO2 98 %.    General appearance: alert, cooperative, appears stated age and no distress Neck: no adenopathy, no carotid bruit, no JVD, supple, symmetrical, trachea midline and thyroid not enlarged, symmetric, no tenderness/mass/nodules Lungs: clear to auscultation bilaterally Chest wall: no tenderness Heart: regular rate and rhythm, S1, S2 normal, no S3 or S4, systolic murmur: systolic ejection 1/6,   at 2nd right intercostal space, at apex, no click and no rub Abdomen: soft, non-tender; bowel sounds normal; no masses,  no organomegaly Extremities: extremities normal, atraumatic, no cyanosis or edema Pulses: 2+ and symmetric Skin: Skin color, texture, turgor normal. No rashes or lesions  Labs:   Lab Results  Component Value Date   WBC 10.1 03/01/2014   HGB 13.9 03/01/2014   HCT 41.5 03/01/2014   MCV 88.1 03/01/2014   PLT 318 03/01/2014    Recent Labs Lab 03/01/14 0227  NA 140  K 3.9  CL 104  CO2 27  BUN 8  CREATININE 0.85  CALCIUM 9.8  GLUCOSE 100*   No results found for: CKTOTAL, CKMB, CKMBINDEX, TROPONINI  Lipid Panel  No results found for: CHOL, TRIG, HDL, CHOLHDL, VLDL, LDLCALC  EKG 02/28/2014: Normal sinus rhythm, left atrial enlargement, incomplete right bundle branch block. No evidence of ischemia.   Radiology:  CT angiogram head and neck 02/28/2014: MPRESSION: Advanced atherosclerotic vascular disease.  50-60% stenosis of the left vertebral artery at the foramen magnum. This is the dominant vessel.  Extensive atherosclerotic calcification in both carotid siphon regions. On the right, there is a 3 mm medially projecting atherosclerotic aneurysm. Stenosis in the distal siphon is estimated at 70% on the right. Maximal stenosis the proximal siphon on the left is estimated at 50%.  Enlargement of the distal carotid canal on the left and the left cavernous sinus region. I do not think this likely relates to a carotid aneurysm. Conceivably, chronic dissection with a chronically  thrombosis pseudo aneurysm could give this appearance, but I do not favor that. This could be a congenital chronic finding. I considered the possibility of a schwannoma resulting in this appearance. I think MRI, intracranial MR angiography, and postcontrast MRI will be necessary to fully understand this appearance.   Electronically Signed   By: Paulina FusiMark  Shogry M.D.   On: 02/28/2014 18:45    ASSESSMENT AND PLAN:  1. Hypertension with hypertensive urgency: renal artery duplex pending, do not suspect renal artery stenosis. Agree with continuing clonidine at this time to prevent rebound hypertension as she has been taking 0.3mg  prior to admission.  2. Advanced cerebral atherosclerosis 3. Tobacco use disorder  Recommendation: I have reviewed her chart, at this point I discussed with pulmonary crackles care Dr. Micah Flesheran Feinstein and we are in agreement with management of the hypertension with beta blocker and consider ARB or ACE inhibitor. He will take over her care for now, he would like me to follow the patient up in the outpatient basis. I would also recommend obtaining a fasting lipid profile due to advanced cerebral atherosclerosis. I have discussed extensively with the  patient regarding smoking cessation.   Erling Conte, NP-C 03/01/2014, 9:37 AM [facility] Pager: [phone] Office: [phone]  I have personally reviewed the patient's record and performed physical exam and agree with the assessment and plan of Ms. Marcy Salvo, NP-C.  Pamella Pert, MD [facility] Pager: [phone].   Office: [phone] If no answer: Mobile: [phone]

## 2014-03-01 NOTE — Progress Notes (Signed)
  Echocardiogram 2D Echocardiogram has been performed.  Cathie BeamsGREGORY, Fredi Geiler 03/01/2014, 2:44 PM

## 2014-03-01 NOTE — Progress Notes (Signed)
VASCULAR LAB PRELIMINARY  PRELIMINARY  PRELIMINARY  PRELIMINARY   Renal Artery Duplex Bilateral completed.    Preliminary report:  RT renal / aortic ratio:  2.18                                 LT renal / aortic ratio:  2.72 RAR<3.5: Suggestive of 0-59% stenosis.    No obvious evidence of abdominal aortic aneurysm.  Redmond Pullingltzroth, Marcena Dias F, RVT 03/01/2014, 4:42 PM

## 2014-03-01 NOTE — Consult Note (Signed)
NEURO HOSPITALIST CONSULT NOTE    Reason for Consult: possible carotid dissection on the left and 3mm aneurysm in the right carotid artery  HPI:                                                                                                                                          Nicole Best is an 64 y.o. female who woke up yesterday am at 0400 hours and noted she felt "dizzy/woozy" in her head.  Upon standing she noted the room was moving from left to right but once she sat down this movement subsided. She felt ok the rest of the morning while getting ready for work but upon getting into her car she noted she felt dizzy again.  This time there was no vertigo but only a "Woozy sensation.  She denies any dysarthria, diplopia, dysphagia or numbness/tingling of extremities.  Patient called her PCP who recommended her to com to hospital. Her BP was 188/113 while in ED.   Past Medical History  Diagnosis Date  . Hypertension   . Renal disorder     Past Surgical History  Procedure Laterality Date  . Colonoscopy    . Renal artery angioplasty    . Vaginal hysterectomy      Family History  Problem Relation Age of Onset  . Hypertension Mother   . Hypertension Father     Social History:  reports that she has been smoking Cigarettes.  She has been smoking about 0.50 packs per day. She does not have any smokeless tobacco history on file. She reports that she does not drink alcohol or use illicit drugs.  No Known Allergies  MEDICATIONS:                                                                                                                     Prior to Admission:  Prescriptions prior to admission  Medication Sig Dispense Refill Last Dose  . aspirin 325 MG tablet Take 325 mg by mouth See admin instructions. Only take twice a week no specific days   Past Week at Unknown time  . cloNIDine (CATAPRES) 0.3 MG tablet Take 0.3 mg by mouth 2 (two) times daily.    02/28/2014 at Unknown time  . fluticasone (FLONASE) 50  MCG/ACT nasal spray Place 1 spray into both nostrils daily as needed for allergies or rhinitis.   Past Week at Unknown time  . lisinopril (PRINIVIL,ZESTRIL) 2.5 MG tablet Take 2.5 mg by mouth daily.   02/28/2014 at Unknown time  . lisinopril (PRINIVIL,ZESTRIL) 20 MG tablet Take 20 mg by mouth daily.   02/28/2014 at Unknown time   Scheduled: . cloNIDine  0.3 mg Oral BID  . enoxaparin (LOVENOX) injection  40 mg Subcutaneous QHS     ROS:                                                                                                                                       History obtained from the patient  General ROS: negative for - chills, fatigue, fever, night sweats, weight gain or weight loss Psychological ROS: negative for - behavioral disorder, hallucinations, memory difficulties, mood swings or suicidal ideation Ophthalmic ROS: negative for - blurry vision, double vision, eye pain or loss of vision ENT ROS: negative for - epistaxis, nasal discharge, oral lesions, sore throat, tinnitus or vertigo Allergy and Immunology ROS: negative for - hives or itchy/watery eyes Hematological and Lymphatic ROS: negative for - bleeding problems, bruising or swollen lymph nodes Endocrine ROS: negative for - galactorrhea, hair pattern changes, polydipsia/polyuria or temperature intolerance Respiratory ROS: negative for - cough, hemoptysis, shortness of breath or wheezing Cardiovascular ROS: negative for - chest pain, dyspnea on exertion, edema or irregular heartbeat Gastrointestinal ROS: negative for - abdominal pain, diarrhea, hematemesis, nausea/vomiting or stool incontinence Genito-Urinary ROS: negative for - dysuria, hematuria, incontinence or urinary frequency/urgency Musculoskeletal ROS: negative for - joint swelling or muscular weakness Neurological ROS: as noted in HPI Dermatological ROS: negative for rash and skin lesion changes   Blood pressure  162/94, pulse 98, temperature 98.7 F (37.1 C), temperature source Oral, resp. rate 22, height 5\' 8"  (1.727 m), weight 68.04 kg (150 lb), SpO2 99 %.   Neurologic Examination:                                                                                                      HEENT-  Normocephalic, no lesions, without obvious abnormality.  Normal external eye and conjunctiva.  Normal TM's bilaterally.  Normal auditory canals and external ears. Normal external nose, mucus membranes and septum.  Normal pharynx. Cardiovascular- regular rate and rhythm, S1, S2 normal, no murmur, click, rub or gallop, pulses palpable throughout   Lungs- chest clear, no wheezing, rales, normal symmetric air entry  Abdomen- normal findings: bowel sounds normal Extremities- less then 2 second capillary refill and no edema Lymph-no adenopathy palpable Musculoskeletal-no muscular tenderness noted Skin-warm and dry, no hyperpigmentation, vitiligo, or suspicious lesions  Neurological Examination Mental Status: Alert, oriented, thought content appropriate.  Speech fluent without evidence of aphasia.  Able to follow 3 step commands without difficulty. Cranial Nerves: II: Discs flat bilaterally; Visual fields grossly normal, pupils equal, round, reactive to light and accommodation III,IV, VI: ptosis not present, extra-ocular motions intact bilaterally V,VII: smile symmetric, facial light touch sensation normal bilaterally VIII: hearing normal bilaterally IX,X: gag reflex present XI: bilateral shoulder shrug XII: midline tongue extension Motor: Right : Upper extremity   5/5    Left:     Upper extremity   5/5  Lower extremity   5/5     Lower extremity   5/5 Tone and bulk:normal tone throughout; no atrophy noted Sensory: Pinprick and light touch intact throughout, bilaterally Deep Tendon Reflexes: 2+ and symmetric throughout Plantars: Right: downgoing   Left: downgoing Cerebellar: normal finger-to-nose,  normal  heel-to-shin test Gait: normal gait and station      Lab Results: Basic Metabolic Panel:  Recent Labs Lab 02/28/14 1623 03/01/14 0227  NA 137 140  K 4.2 3.9  CL 104 104  CO2 23 27  GLUCOSE 97 100*  BUN 10 8  CREATININE 0.82 0.85  CALCIUM 9.6 9.8    Liver Function Tests: No results for input(s): AST, ALT, ALKPHOS, BILITOT, PROT, ALBUMIN in the last 168 hours. No results for input(s): LIPASE, AMYLASE in the last 168 hours. No results for input(s): AMMONIA in the last 168 hours.  CBC:  Recent Labs Lab 02/28/14 1623 03/01/14 0227  WBC 8.1 10.1  HGB 13.2 13.9  HCT 40.2 41.5  MCV 88.5 88.1  PLT 358 318    Cardiac Enzymes: No results for input(s): CKTOTAL, CKMB, CKMBINDEX, TROPONINI in the last 168 hours.  Lipid Panel: No results for input(s): CHOL, TRIG, HDL, CHOLHDL, VLDL, LDLCALC in the last 168 hours.  CBG:  Recent Labs Lab 02/28/14 2230  GLUCAP 119*    Microbiology: Results for orders placed or performed during the hospital encounter of 02/28/14  MRSA PCR Screening     Status: None   Collection Time: 02/28/14 10:52 PM  Result Value Ref Range Status   MRSA by PCR NEGATIVE NEGATIVE Final    Comment:        The GeneXpert MRSA Assay (FDA approved for NASAL specimens only), is one component of a comprehensive MRSA colonization surveillance program. It is not intended to diagnose MRSA infection nor to guide or monitor treatment for MRSA infections.     Coagulation Studies:  Recent Labs  03/01/14 0227  LABPROT 13.2  INR 0.99    Imaging: Ct Angio Head W/cm &/or Wo Cm  02/28/2014   CLINICAL DATA:  Hypertension.  Dizziness and lightheaded.  EXAM: CT ANGIOGRAPHY HEAD  TECHNIQUE: Multidetector CT imaging of the head was performed using the standard protocol during bolus administration of intravenous contrast. Multiplanar CT image reconstructions and MIPs were obtained to evaluate the vascular anatomy.  CONTRAST:  50mL OMNIPAQUE IOHEXOL 350 MG/ML  SOLN  COMPARISON:  None.  FINDINGS: CT HEAD  No acute stroke is seen. There are areas of low density in the hemispheric white matter likely representing chronic small-vessel ischemic changes. No cortical stroke. No mass lesion, hemorrhage, hydrocephalus or extra-axial collection. No abnormal contrast enhancement seen.  CTA HEAD  Anterior circulation: Both internal carotid arteries are  widely patent through the skullbase. There is calcification of the carotid artery walls in the carotid siphon regions. There is no atherosclerotic 3 mm aneurysm projecting medially from the carotid siphon on the right. Stenosis in the distal siphon on the right approaches 70%. Both anterior and middle cerebral vessels are widely patent without stenosis, aneurysm or vascular malformation.  Posterior circulation:  The left vertebral artery is dominant. There is atherosclerotic calcification at the foramen magnum with narrowing of the left vertebral artery by 50-60%. The basilar artery shows mild atherosclerotic irregularity and is tortuous but there is no basilar stenosis. Posterior circulation branch vessels are patent. No posterior circulation aneurysm.  There is enlargement of the distal carotid canal on the left and enlargement of the cavernous sinus region, filled by a low density material (30 Hounsfield units). There is no sign of peripheral calcification to suggest pseudo aneurysm.  Venous sinuses: Widely patent  IMPRESSION: Advanced atherosclerotic vascular disease.  50-60% stenosis of the left vertebral artery at the foramen magnum. This is the dominant vessel.  Extensive atherosclerotic calcification in both carotid siphon regions. On the right, there is a 3 mm medially projecting atherosclerotic aneurysm. Stenosis in the distal siphon is estimated at 70% on the right. Maximal stenosis the proximal siphon on the left is estimated at 50%.  Enlargement of the distal carotid canal on the left and the left cavernous sinus region. I  do not think this likely relates to a carotid aneurysm. Conceivably, chronic dissection with a chronically thrombosis pseudo aneurysm could give this appearance, but I do not favor that. This could be a congenital chronic finding. I considered the possibility of a schwannoma resulting in this appearance. I think MRI, intracranial MR angiography, and postcontrast MRI will be necessary to fully understand this appearance.   Electronically Signed   By: Paulina FusiMark  Shogry M.D.   On: 02/28/2014 18:45   Ct Head Wo Contrast  02/28/2014   CLINICAL DATA:  Dizziness and lightheadedness. This began earlier today. Initial encounter.  EXAM: CT HEAD WITHOUT CONTRAST  TECHNIQUE: Contiguous axial images were obtained from the base of the skull through the vertex without intravenous contrast.  COMPARISON:  None.  FINDINGS: Dolichoectatic and calcified cerebral vasculature, particularly the vertebrobasilar system where there is calcification involving the dominant LEFT vertebral as well as the mid basilar artery. Carotid siphons are also heavily calcified.  There is smooth enlargement of the LEFT carotid canal and surrounding osseous structures, including the posterior wall of the LEFT sphenoid sinus and medial sella. It is unclear if this is due to dolichoectasia (more likely) or carotid aneurysm. Consider CTA head/neck for further evaluation if clinically indicated.  No evidence for acute infarction, hemorrhage, mass lesion, hydrocephalus, or extra-axial fluid. Normal cerebral volume. Slight hypoattenuation of white matter, could represent early small vessel disease.  The calvarium is intact. There is no acute sinus or mastoid disease.  IMPRESSION: Smooth enlargement of the LEFT carotid canal and surrounding osseous structures with dehiscence of the posterior wall sphenoid on the LEFT. Likely extreme dolichoectasia but carotid aneurysm not excluded. Consider CTA head/neck for further evaluation. See discussion above  Posterior  circulation also calcified and significantly dolichoectatic.  No definite acute intracranial findings. Normal cerebral volume with hypoattenuation white matter, likely chronic microvascular ischemic change. No evidence for cortical infarction or CT signs of proximal vascular thrombosis.   Electronically Signed   By: Davonna BellingJohn  Curnes M.D.   On: 02/28/2014 16:12   Dg Chest Portable 1 View  02/28/2014  CLINICAL DATA:  Hypertension for 1 day.  EXAM: PORTABLE CHEST - 1 VIEW  COMPARISON:  PA and lateral chest 12/25/2006.  FINDINGS: Heart size is upper normal. The lungs are clear. There is no pneumothorax or pleural effusion.  IMPRESSION: No acute disease.   Electronically Signed   By: Drusilla Kanner M.D.   On: 02/28/2014 15:51       Assessment and plan per attending neurologist  Felicie Morn PA-C [facility] [phone]  03/01/2014, 11:05 AM   Assessment/Plan:  64 YO female with transient vertigo and dizzy sensation.  Exam shows no focal findings but CTA showed 3 mm aneurysm projecting medially from the carotid siphon on the right and possible  enlargement of the distal carotid canal on the left and the left cavernous sinus region.  MRI/MRA brain has been ordered to further evaluate for chronic versus acute dissection.  OF note, patient presented to ED with hypertensive urgency which may be the etiology of her dizziness.  BP is now well controlled and symptoms have subsided. We will continue to follow findings with you.   Recommend: 1) ASA 81 mg daily 2) agree with further MRI/MRA studies.  3)Will follow  Elspeth Cho, DO [facility] [phone]  If 7pm- 7am, please page neurology on call as listed in [facility].

## 2014-03-02 ENCOUNTER — Encounter (HOSPITAL_COMMUNITY): Payer: Self-pay

## 2014-03-02 LAB — BASIC METABOLIC PANEL
Anion gap: 9 (ref 5–15)
BUN: 10 mg/dL (ref 6–23)
CO2: 24 mmol/L (ref 19–32)
Calcium: 8.7 mg/dL (ref 8.4–10.5)
Chloride: 101 mEq/L (ref 96–112)
Creatinine, Ser: 0.76 mg/dL (ref 0.50–1.10)
GFR calc Af Amer: >90 mL/min (ref >90)
GFR calc non Af Amer: 89 mL/min — ABNORMAL LOW (ref >90)
Glucose, Bld: 132 mg/dL — ABNORMAL HIGH (ref 70–99)
Potassium: 4.1 mmol/L (ref 3.5–5.1)
Sodium: 134 mmol/L — ABNORMAL LOW (ref 135–145)

## 2014-03-02 LAB — LIPID PANEL
CHOLESTEROL: 115 mg/dL (ref 0–200)
HDL: 41 mg/dL (ref >39)
LDL Cholesterol: 59 mg/dL (ref 0–99)
Total CHOL/HDL Ratio: 2.8 RATIO
Triglycerides: 73 mg/dL (ref <150)
VLDL: 15 mg/dL (ref 0–40)

## 2014-03-02 LAB — URIC ACID, RANDOM URINE: URIC ACID, URINE: 81.4 mg/dL

## 2014-03-02 LAB — SODIUM, URINE, RANDOM: Sodium, Ur: 43 mmol/L

## 2014-03-02 LAB — OSMOLALITY, URINE: Osmolality, Ur: 862 mOsm/kg (ref 390–1090)

## 2014-03-02 LAB — OSMOLALITY: Osmolality: 295 mOsm/kg (ref 275–300)

## 2014-03-02 MED ORDER — ASPIRIN EC 325 MG PO TBEC
325.0000 mg | DELAYED_RELEASE_TABLET | Freq: Every day | ORAL | Status: DC
  Administered 2014-03-02 – 2014-03-06 (×5): 325 mg via ORAL
  Filled 2014-03-02 (×6): qty 1

## 2014-03-02 MED ORDER — PNEUMOCOCCAL VAC POLYVALENT 25 MCG/0.5ML IJ INJ
0.5000 mL | INJECTION | INTRAMUSCULAR | Status: AC
Start: 2014-03-03 — End: 2014-03-06
  Administered 2014-03-06: 0.5 mL via INTRAMUSCULAR
  Filled 2014-03-02 (×2): qty 0.5

## 2014-03-02 MED ORDER — HYDRALAZINE HCL 25 MG PO TABS
25.0000 mg | ORAL_TABLET | Freq: Three times a day (TID) | ORAL | Status: DC
  Administered 2014-03-02 – 2014-03-05 (×9): 25 mg via ORAL
  Filled 2014-03-02 (×13): qty 1

## 2014-03-02 MED ORDER — INFLUENZA VAC SPLIT QUAD 0.5 ML IM SUSY
0.5000 mL | PREFILLED_SYRINGE | INTRAMUSCULAR | Status: AC
Start: 2014-03-03 — End: 2014-03-05
  Administered 2014-03-05: 0.5 mL via INTRAMUSCULAR
  Filled 2014-03-02 (×2): qty 0.5

## 2014-03-02 NOTE — Progress Notes (Signed)
Attempted to call report on 2M05 to 5W.

## 2014-03-02 NOTE — Progress Notes (Signed)
Subjective: Patient has no complaints.  I have explained MRI results and patient understands and has no further questions.   Objective: Current vital signs: BP 143/98 mmHg  Pulse 68  Temp(Src) 98.4 F (36.9 C) (Oral)  Resp 12  Ht  (1.727 m)  Wt 68.04 kg (150 lb)  BMI 22.81 kg/m2  SpO2 100% Vital signs in last 24 hours: Temp:  [98.1 F (36.7 C)-98.9 F (37.2 C)] 98.4 F (36.9 C) (01/06 0743) Pulse Rate:  [33-112] 68 (01/06 0800) Resp:  [11-26] 12 (01/06 0800) BP: (101-169)/(63-123) 143/98 mmHg (01/06 0800) SpO2:  [90 %-100 %] 100 % (01/06 0800)  Intake/Output from previous day: 01/05 0701 - 01/06 0700 In: 762.8 [P.O.:480; I.V.:282.8] Out: -  Intake/Output this shift:   Nutritional status: Diet 2 gram sodium  Neurologic Exam: Mental Status: Alert, oriented, thought content appropriate. Speech fluent without evidence of aphasia. Able to follow 3 step commands without difficulty. Cranial Nerves: II: Discs flat bilaterally; Visual fields grossly normal, pupils equal, round, reactive to light and accommodation III,IV, VI: ptosis not present, extra-ocular motions intact bilaterally V,VII: smile symmetric, facial light touch sensation normal bilaterally VIII: hearing normal bilaterally IX,X: gag reflex present XI: bilateral shoulder shrug XII: midline tongue extension Motor: Right :Upper extremity 5/5Left: Upper extremity 5/5 Lower extremity 5/5Lower extremity 5/5 Tone and bulk:normal tone throughout; no atrophy noted Sensory: Pinprick and light touch intact throughout, bilaterally Deep Tendon Reflexes: 2+ and symmetric throughout Plantars: Right: downgoingLeft: downgoing   Lab Results: Basic Metabolic Panel:  Recent Labs Lab 02/28/14 1623 03/01/14 0227 03/02/14 0222  NA 137 140 134*  K 4.2 3.9 4.1  CL 104 104  101  CO2 GLUCOSE 97 100* 132*  BUN CREATININE 0.82 0.85 0.76  CALCIUM 9.6 9.8 8.7    Liver Function Tests: No results for input(s): AST, ALT, ALKPHOS, BILITOT, PROT, ALBUMIN in the last 168 hours. No results for input(s): LIPASE, AMYLASE in the last 168 hours. No results for input(s): AMMONIA in the last 168 hours.  CBC:  Recent Labs Lab 02/28/14 1623 03/01/14 0227  WBC 8.1 10.1  HGB 13.2 13.9  HCT 40.2 41.5  MCV 88.5 88.1  PLT 358 318    Cardiac Enzymes: No results for input(s): CKTOTAL, CKMB, CKMBINDEX, TROPONINI in the last 168 hours.  Lipid Panel:  Recent Labs Lab 03/02/14 0222  CHOL 115  TRIG 73  HDL 41  CHOLHDL 2.8  VLDL 15  LDLCALC 59    CBG:  Recent Labs Lab 02/28/14 2230  GLUCAP 119*    Microbiology: Results for orders placed or performed during the hospital encounter of 02/28/14  MRSA PCR Screening     Status: None   Collection Time: 02/28/14 10:52 PM  Result Value Ref Range Status   MRSA by PCR NEGATIVE NEGATIVE Final    Comment:        The GeneXpert MRSA Assay (FDA approved for NASAL specimens only), is one component of a comprehensive MRSA colonization surveillance program. It is not intended to diagnose MRSA infection nor to guide or monitor treatment for MRSA infections.     Coagulation Studies:  Recent Labs  03/01/14 0227  LABPROT 13.2  INR 0.99    Imaging: Ct Angio Head W/cm &/or Wo Cm  02/28/2014   CLINICAL DATA:  Hypertension.  Dizziness and lightheaded.  EXAM: CT ANGIOGRAPHY HEAD  TECHNIQUE: Multidetector CT imaging of the head was performed using the standard protocol during bolus administration of  intravenous contrast. Multiplanar CT image reconstructions and MIPs were obtained to evaluate the vascular anatomy.  CONTRAST:  50mL OMNIPAQUE IOHEXOL 350 MG/ML SOLN  COMPARISON:  None.  FINDINGS: CT HEAD  No acute stroke is seen. There are areas of low density in the hemispheric white matter likely  representing chronic small-vessel ischemic changes. No cortical stroke. No mass lesion, hemorrhage, hydrocephalus or extra-axial collection. No abnormal contrast enhancement seen.  CTA HEAD  Anterior circulation: Both internal carotid arteries are widely patent through the skullbase. There is calcification of the carotid artery walls in the carotid siphon regions. There is no atherosclerotic 3 mm aneurysm projecting medially from the carotid siphon on the right. Stenosis in the distal siphon on the right approaches 70%. Both anterior and middle cerebral vessels are widely patent without stenosis, aneurysm or vascular malformation.  Posterior circulation:  The left vertebral artery is dominant. There is atherosclerotic calcification at the foramen magnum with narrowing of the left vertebral artery by 50-60%. The basilar artery shows mild atherosclerotic irregularity and is tortuous but there is no basilar stenosis. Posterior circulation branch vessels are patent. No posterior circulation aneurysm.  There is enlargement of the distal carotid canal on the left and enlargement of the cavernous sinus region, filled by a low density material (30 Hounsfield units). There is no sign of peripheral calcification to suggest pseudo aneurysm.  Venous sinuses: Widely patent  IMPRESSION: Advanced atherosclerotic vascular disease.  50-60% stenosis of the left vertebral artery at the foramen magnum. This is the dominant vessel.  Extensive atherosclerotic calcification in both carotid siphon regions. On the right, there is a 3 mm medially projecting atherosclerotic aneurysm. Stenosis in the distal siphon is estimated at 70% on the right. Maximal stenosis the proximal siphon on the left is estimated at 50%.  Enlargement of the distal carotid canal on the left and the left cavernous sinus region. I do not think this likely relates to a carotid aneurysm. Conceivably, chronic dissection with a chronically thrombosis pseudo aneurysm could  give this appearance, but I do not favor that. This could be a congenital chronic finding. I considered the possibility of a schwannoma resulting in this appearance. I think MRI, intracranial MR angiography, and postcontrast MRI will be necessary to fully understand this appearance.   Electronically Signed   By: Paulina FusiMark  Shogry M.D.   On: 02/28/2014 18:45   Ct Head Wo Contrast  02/28/2014   CLINICAL DATA:  Dizziness and lightheadedness. This began earlier today. Initial encounter.  EXAM: CT HEAD WITHOUT CONTRAST  TECHNIQUE: Contiguous axial images were obtained from the base of the skull through the vertex without intravenous contrast.  COMPARISON:  None.  FINDINGS: Dolichoectatic and calcified cerebral vasculature, particularly the vertebrobasilar system where there is calcification involving the dominant LEFT vertebral as well as the mid basilar artery. Carotid siphons are also heavily calcified.  There is smooth enlargement of the LEFT carotid canal and surrounding osseous structures, including the posterior wall of the LEFT sphenoid sinus and medial sella. It is unclear if this is due to dolichoectasia (more likely) or carotid aneurysm. Consider CTA head/neck for further evaluation if clinically indicated.  No evidence for acute infarction, hemorrhage, mass lesion, hydrocephalus, or extra-axial fluid. Normal cerebral volume. Slight hypoattenuation of white matter, could represent early small vessel disease.  The calvarium is intact. There is no acute sinus or mastoid disease.  IMPRESSION: Smooth enlargement of the LEFT carotid canal and surrounding osseous structures with dehiscence of the posterior wall sphenoid on the  LEFT. Likely extreme dolichoectasia but carotid aneurysm not excluded. Consider CTA head/neck for further evaluation. See discussion above  Posterior circulation also calcified and significantly dolichoectatic.  No definite acute intracranial findings. Normal cerebral volume with hypoattenuation  white matter, likely chronic microvascular ischemic change. No evidence for cortical infarction or CT signs of proximal vascular thrombosis.   Electronically Signed   By: Davonna Belling M.D.   On: 02/28/2014 16:12   Mr Nicole Best Head Wo Contrast  03/01/2014   CLINICAL DATA:  Dizziness and lightheadedness beginning 02/28/2014.  EXAM: MRI HEAD WITHOUT AND WITH CONTRAST  MRA HEAD WITHOUT CONTRAST  TECHNIQUE: Multiplanar, multiecho pulse sequences of the brain and surrounding structures were obtained without and with intravenous contrast. Angiographic images of the head were obtained using MRA technique without contrast.  CONTRAST:  15mL MULTIHANCE GADOBENATE DIMEGLUMINE 529 MG/ML IV SOLN  COMPARISON:  CT head without 02/28/2014.  CT angio head 02/28/2014.  FINDINGS: MRI HEAD FINDINGS  No acute stroke or hemorrhage. No mass lesion or hydrocephalus. No extra-axial fluid. Normal for age cerebral volume. Extensive T2 and FLAIR hyperintensity throughout the periventricular and subcortical white matter representing chronic microvascular ischemic change. Remote LEFT periventricular lacunar infarct. Widespread ischemic demyelination in the brainstem, particularly the pons related to small vessel disease.  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 There is no significant luminal narrowing. This fusiform dissecting aneurysm measures 11 x 12 mm cross-section at its mid cavernous segment as seen on image 7 series 16.  There is diminished flow related enhancement in the distal RIGHT vertebral. As seen on postcontrast images, the distal RIGHT vertebral V4 segment is as larger or larger than the LEFT vertebral measuring 5 x 6 mm cross-section on image 4 series 16. The luminal narrowing observed on CTA and discussed further in the MRA section likely relates to a dissecting aneurysm of this vessel.   Minor sinus disease.  Negative orbits.  No osseous lesions.  MRA HEAD FINDINGS  The RIGHT ICA upper cervical and petrous segments are normal. There is minor atheromatous change in the cavernous ICA on the RIGHT with an atherosclerotic outpouching as observed with CT. RIGHT supraclinoid ICA is narrowed, approximate 50% stenosis. RIGHT ICA terminus widely patent.  No significant narrowing or irregularity of the upper cervical, petrous, cavernous, or supraclinoid ICA on the LEFT, despite the finding of fusiform dissecting petrous and cavernous aneurysm. There is insufficient flow in this aneurysm to result in signal on MRA. The supraclinoid LEFT ICA is dolichoectatic and more vertical in appearance than on the RIGHT, but there are no flow reducing lesions.  The basilar artery is markedly dolichoectatic. There is a 50-75% stenosis of the V4 segment of the LEFT vertebral which is dominant. The distal RIGHT vertebral is patent from the vertebral confluence towards the foramen magnum, and the RIGHT PICA observed as a branching vessel, but the distal V3 and proximal V4 RIGHT vertebral segments are not seen, with suspected severe narrowing of these segments due to a similar dissecting aneurysm.  Fetal origin RIGHT PCA. LEFT PCA widely patent without cerebellar branch occlusion. No flow reducing lesion of the anterior or middle cerebral arteries.  No intracranial berry aneurysm.  IMPRESSION: Moderately advanced small vessel disease without acute cerebral infarction.  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 The aneurysm displays postcontrast enhancement, but has insufficient flow to be seen on MRA.  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  50% stenosis of the  supraclinoid ICA on the RIGHT. Atheromatous outpouching from the cavernous segment correlates with the observed CTA findings of small medially projecting aneurysm.  If further investigation is desired, consider formal catheter angiogram to evaluate for the need for endovascular treatment.   Electronically Signed   By: Davonna Belling M.D.   On: 03/01/2014 20:17   Mr Nicole Best Contrast  03/01/2014   CLINICAL DATA:  Dizziness and lightheadedness beginning 02/28/2014.  EXAM: MRI HEAD WITHOUT AND WITH CONTRAST  MRA HEAD WITHOUT CONTRAST  TECHNIQUE: Multiplanar, multiecho pulse sequences of the brain and surrounding structures were obtained without and with intravenous contrast. Angiographic images of the head were obtained using MRA technique without contrast.  CONTRAST:  15mL MULTIHANCE GADOBENATE DIMEGLUMINE 529 MG/ML IV SOLN  COMPARISON:  CT head without 02/28/2014.  CT angio head 02/28/2014.  FINDINGS: MRI HEAD FINDINGS  No acute stroke or hemorrhage. No mass lesion or hydrocephalus. No extra-axial fluid. Normal for age cerebral volume. Extensive T2 and FLAIR hyperintensity throughout the periventricular and subcortical white matter representing chronic microvascular ischemic change. Remote LEFT periventricular lacunar infarct. Widespread ischemic demyelination in the brainstem, particularly the pons related to small vessel disease.  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 There is no significant luminal narrowing. This fusiform dissecting aneurysm measures 11 x 12 mm cross-section at its mid cavernous segment as seen on image 7 series 16.  There is diminished flow related enhancement in the distal RIGHT vertebral. As seen on postcontrast images, the distal RIGHT vertebral V4 segment is as larger or larger than the LEFT vertebral measuring 5 x 6 mm cross-section on image 4  series 16. The luminal narrowing observed on CTA and discussed further in the MRA section likely relates to a dissecting aneurysm of this vessel.  Minor sinus disease.  Negative orbits.  No osseous lesions.  MRA HEAD FINDINGS  The RIGHT ICA upper cervical and petrous segments are normal. There is minor atheromatous change in the cavernous ICA on the RIGHT with an atherosclerotic outpouching as observed with CT. RIGHT supraclinoid ICA is narrowed, approximate 50% stenosis. RIGHT ICA terminus widely patent.  No significant narrowing or irregularity of the upper cervical, petrous, cavernous, or supraclinoid ICA on the LEFT, despite the finding of fusiform dissecting petrous and cavernous aneurysm. There is insufficient flow in this aneurysm to result in signal on MRA. The supraclinoid LEFT ICA is dolichoectatic and more vertical in appearance than on the RIGHT, but there are no flow reducing lesions.  The basilar artery is markedly dolichoectatic. There is a 50-75% stenosis of the V4 segment of the LEFT vertebral which is dominant. The distal RIGHT vertebral is patent from the vertebral confluence towards the foramen magnum, and the RIGHT PICA observed as a branching vessel, but the distal V3 and proximal V4 RIGHT vertebral segments are not seen, with suspected severe narrowing of these segments due to a similar dissecting aneurysm.  Fetal origin RIGHT PCA. LEFT PCA widely patent without cerebellar branch occlusion. No flow reducing lesion of the anterior or middle cerebral arteries.  No intracranial berry aneurysm.  IMPRESSION: Moderately advanced small  vessel disease without acute cerebral infarction.  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 The aneurysm displays postcontrast enhancement, but has insufficient flow to be seen on MRA.   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  50% stenosis of the supraclinoid ICA on the RIGHT. Atheromatous outpouching from the cavernous segment correlates with the observed CTA findings of small medially projecting aneurysm.  If further investigation is desired, consider formal catheter angiogram to evaluate for the need for endovascular treatment.   Electronically Signed   By: Davonna Belling M.D.   On: 03/01/2014 20:17   Dg Chest Portable 1 View  02/28/2014   CLINICAL DATA:  Hypertension for 1 day.  EXAM: PORTABLE CHEST - 1 VIEW  COMPARISON:  PA and lateral chest 12/25/2006.  FINDINGS: Heart size is upper normal. The lungs are clear. There is no pneumothorax or pleural effusion.  IMPRESSION: No acute disease.   Electronically Signed   By: Drusilla Kanner M.D.   On: 02/28/2014 15:51    Medications:  Prior to Admission:  Prescriptions prior to admission  Medication Sig Dispense Refill Last Dose  . aspirin 325 MG tablet Take 325 mg by mouth See admin instructions. Only take twice a week no specific days   Past Week at Unknown time  . cloNIDine (CATAPRES) 0.3 MG tablet Take 0.3 mg by mouth 2 (two) times daily.   02/28/2014 at Unknown time  . fluticasone (FLONASE) 50 MCG/ACT nasal spray Place 1 spray into both nostrils daily as needed for allergies or rhinitis.   Past Week at Unknown time  . lisinopril (PRINIVIL,ZESTRIL) 2.5 MG tablet Take 2.5 mg by mouth daily.   02/28/2014 at Unknown time  . lisinopril (PRINIVIL,ZESTRIL) 20 MG tablet Take 20 mg by mouth daily.   02/28/2014 at Unknown time   Scheduled: . aspirin EC  81 mg Oral Daily  . cloNIDine  0.3 mg Oral BID  . 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    Recommend: 1) BP control 2) ASA 325 mg daily 3) Follow up with Dr Gearldine Shown in one month. [facility] [phone]  Neurology will S/O at this time. Call with any questions.   Felicie Morn PA-C [facility] [phone]  03/02/2014, 9:05 AM

## 2014-03-02 NOTE — Progress Notes (Signed)
PULMONARY / CRITICAL CARE MEDICINE   Name: Nicole Best MRN: [id number] DOB: 1950/12/16    ADMISSION DATE:  02/28/2014 CONSULTATION DATE:  02/28/2014  REFERRING MD :  ED - Jeraldine Loots, MD  CHIEF COMPLAINT:  HTN emergency  INITIAL PRESENTATION: 64 year old woman with history of HTN, renal artery stenosis s/p R renal angioplasty 5-6 years ago presenting with LH/dizziness found to have BP 188/113 at PCP office. Started on esmolol gtt in ED.   STUDIES:  CT Head w/o 02/28/2014 >> Smooth enlargement of the L carotid canal and surrounding osseous structures with dehiscence of the posterior wall sphenoid on the L. No definite acute intracranial findings. CTA Head and Neck 02/28/2014 >> 50-60% stenosis of the left vertebral artery at the foramen magnum. Extensive atherosclerotic calcification in both carotid siphon regions. On the right, there is a 3 mm medially projecting atherosclerotic aneurysm. Stenosis in the distal siphon is estimated at 70% on the right. Maximal stenosis the proximal siphon on the left is estimated at 50%. Enlargement of the distal carotid canal on the left and the left cavernous sinus region. 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 Possible dissecting aneurysm of distal RIGHT vertebral with severe narrowing of distal V3 and proximal V4 segments.  Renal Artery Duplex Bilateral 03/01/14>> 0-59% stenosis on right. No AAA.  SIGNIFICANT EVENTS: 1/4>> Started on esmolol gtt in ED, switched to cardene gtt. 1/5- off drip   SUBJECTIVE: Awake and alert sitting in bed. Denies neck pain, headache, sudden weakness, changes in vision. Dizziness has mostly resolved, occasional episodes when standing up to use restroom.   VITAL SIGNS: Temp:  [98.1 F (36.7 C)-98.9 F (37.2 C)] 98.1 F (36.7 C) (01/06 0431) Pulse Rate:  [33-112] 70 (01/06 0600) Resp:  [11-26] 14 (01/06 0600) BP:  (101-169)/(63-123) 117/78 mmHg (01/06 0600) SpO2:  [90 %-100 %] 100 % (01/06 0600)   HEMODYNAMICS:     VENTILATOR SETTINGS:     INTAKE / OUTPUT:  Intake/Output Summary (Last 24 hours) at 03/02/14 4098 Last data filed at 03/01/14 2100  Gross per 24 hour  Intake 812.83 ml  Output      0 ml  Net 812.83 ml    PHYSICAL EXAMINATION: General:  Alert, awake, NAD Neuro:  Alert and oriented x 3. EOMI, PERRL, visual fields intact. Smile symmetric. Sensation symmetric bilaterally. Strength 5/5 in upper and lower extremities.  HEENT:  [place]/AT, PERRL, EOMI, mucus membranes moist, no neck bruit or fullness or tenderness, no headache Cardiovascular:  RRR, no m/r/g Lungs:  CTA bilaterally, no wheezing Abdomen:  BS+, soft, nontender Musculoskeletal:  No LE edema Skin:  Warm and well perfused  LABS:  CBC  Recent Labs Lab 02/28/14 1623 03/01/14 0227  WBC 8.1 10.1  HGB 13.2 13.9  HCT 40.2 41.5  PLT 358 318   Coag's  Recent Labs Lab 02/28/14 2308 03/01/14 0227  APTT 30  --   INR  --  0.99     BMET  Recent Labs Lab 02/28/14 1623 03/01/14 0227 03/02/14 0222  NA 137 140 134*  K 4.2 3.9 4.1  CL 104 104 101  CO2 23 27 24   BUN 10 8 10   CREATININE 0.82 0.85 0.76  GLUCOSE 97 100* 132*   Electrolytes  Recent Labs Lab 02/28/14 1623 03/01/14 0227 03/02/14 0222  CALCIUM 9.6 9.8 8.7   Sepsis Markers No results for input(s): LATICACIDVEN, PROCALCITON, O2SATVEN in the last 168 hours.  ABG No results for input(s): PHART, PCO2ART, PO2ART in the last 168 hours.   Liver Enzymes No results for input(s): AST, ALT, ALKPHOS, BILITOT, ALBUMIN in the last 168 hours.   Cardiac Enzymes No results for input(s): TROPONINI, PROBNP in the last 168 hours.   Glucose  Recent Labs Lab 02/28/14 2230  GLUCAP 119*    Imaging Mr [facility] Wo Contrast  03/01/2014   CLINICAL DATA:  Dizziness and lightheadedness beginning 02/28/2014.  EXAM: MRI HEAD WITHOUT AND WITH CONTRAST  MRA HEAD  WITHOUT CONTRAST  TECHNIQUE: Multiplanar, multiecho pulse sequences of the brain and surrounding structures were obtained without and with intravenous contrast. Angiographic images of the head were obtained using MRA technique without contrast.  CONTRAST:  15mL MULTIHANCE GADOBENATE DIMEGLUMINE 529 MG/ML IV SOLN  COMPARISON:  CT head without 02/28/2014.  CT angio head 02/28/2014.  FINDINGS: MRI HEAD FINDINGS  No acute stroke or hemorrhage. No mass lesion or hydrocephalus. No extra-axial fluid. Normal for age cerebral volume. Extensive T2 and FLAIR hyperintensity throughout the periventricular and subcortical white matter representing chronic microvascular ischemic change. Remote LEFT periventricular lacunar infarct. Widespread ischemic demyelination in the brainstem, particularly the pons related to small vessel disease.  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 There is no significant luminal narrowing. This fusiform dissecting aneurysm measures 11 x 12 mm cross-section at its mid cavernous segment as seen on image 7 series 16.  There is diminished flow related enhancement in the distal RIGHT vertebral. As seen on postcontrast images, the distal RIGHT vertebral V4 segment is as larger or larger than the LEFT vertebral measuring 5 x 6 mm cross-section on image 4 series 16. The luminal narrowing observed on CTA and discussed further in the MRA section likely relates to a dissecting aneurysm of this vessel.  Minor sinus disease.  Negative orbits.  No osseous lesions.  MRA HEAD FINDINGS  The RIGHT ICA upper cervical and petrous segments are normal. There is minor atheromatous change in the cavernous ICA on the RIGHT with an atherosclerotic outpouching as observed with CT. RIGHT supraclinoid ICA is narrowed, approximate 50% stenosis. RIGHT ICA terminus widely patent.  No  significant narrowing or irregularity of the upper cervical, petrous, cavernous, or supraclinoid ICA on the LEFT, despite the finding of fusiform dissecting petrous and cavernous aneurysm. There is insufficient flow in this aneurysm to result in signal on MRA. The supraclinoid LEFT ICA is dolichoectatic and more vertical in appearance than on the RIGHT, but there are no flow reducing lesions.  The basilar artery is markedly dolichoectatic. There is a 50-75% stenosis of the V4 segment of the LEFT vertebral which is dominant. The distal RIGHT vertebral is patent from the vertebral confluence towards the foramen magnum, and the RIGHT PICA observed as a branching vessel, but the distal V3 and proximal V4 RIGHT vertebral segments are not seen, with suspected severe narrowing of these segments due to a similar dissecting aneurysm.  Fetal origin RIGHT PCA. LEFT PCA widely patent without cerebellar branch occlusion. No flow reducing lesion of the anterior or middle cerebral arteries.  No intracranial berry aneurysm.  IMPRESSION: Moderately advanced small vessel disease without acute cerebral infarction.  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 The aneurysm displays postcontrast  enhancement, but has insufficient flow to be seen on MRA.  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  50% stenosis of the supraclinoid ICA on the RIGHT. Atheromatous outpouching from the cavernous segment correlates with the observed CTA findings of small medially projecting aneurysm.  If further investigation is desired, consider formal catheter angiogram to evaluate for the need for endovascular treatment.   Electronically Signed   By: Davonna BellingJohn  Curnes M.D.   On: 03/01/2014 20:17   Mr  Laqueta JeanBrain W ZOWo Contrast  03/01/2014   CLINICAL DATA:  Dizziness and lightheadedness beginning 02/28/2014.  EXAM: MRI HEAD WITHOUT AND WITH CONTRAST  MRA HEAD WITHOUT CONTRAST  TECHNIQUE: Multiplanar, multiecho pulse sequences of the brain and surrounding structures were obtained without and with intravenous contrast. Angiographic images of the head were obtained using MRA technique without contrast.  CONTRAST:  15mL MULTIHANCE GADOBENATE DIMEGLUMINE 529 MG/ML IV SOLN  COMPARISON:  CT head without 02/28/2014.  CT angio head 02/28/2014.  FINDINGS: MRI HEAD FINDINGS  No acute stroke or hemorrhage. No mass lesion or hydrocephalus. No extra-axial fluid. Normal for age cerebral volume. Extensive T2 and FLAIR hyperintensity throughout the periventricular and subcortical white matter representing chronic microvascular ischemic change. Remote LEFT periventricular lacunar infarct. Widespread ischemic demyelination in the brainstem, particularly the pons related to small vessel disease.  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 There is no significant luminal narrowing. This fusiform dissecting aneurysm measures 11 x 12 mm cross-section at its mid cavernous segment as seen on image 7 series 16.  There is diminished flow related enhancement in the distal RIGHT vertebral. As seen on postcontrast images, the distal RIGHT vertebral V4 segment is as larger or larger than the LEFT vertebral measuring 5 x 6 mm cross-section on image 4 series 16. The luminal narrowing observed on CTA and discussed further in the MRA section likely relates to a dissecting aneurysm of this vessel.  Minor sinus disease.  Negative orbits.  No osseous lesions.  MRA HEAD FINDINGS  The RIGHT ICA upper cervical and petrous segments are normal. There is minor atheromatous change in the cavernous ICA on the RIGHT with an  atherosclerotic outpouching as observed with CT. RIGHT supraclinoid ICA is narrowed, approximate 50% stenosis. RIGHT ICA terminus widely patent.  No significant narrowing or irregularity of the upper cervical, petrous, cavernous, or supraclinoid ICA on the LEFT, despite the finding of fusiform dissecting petrous and cavernous aneurysm. There is insufficient flow in this aneurysm to result in signal on MRA. The supraclinoid LEFT ICA is dolichoectatic and more vertical in appearance than on the RIGHT, but there are no flow reducing lesions.  The basilar artery is markedly dolichoectatic. There is a 50-75% stenosis of the V4 segment of the LEFT vertebral which is dominant. The distal RIGHT vertebral is patent from the vertebral confluence towards the foramen magnum, and the RIGHT PICA observed as a branching vessel, but the distal V3 and proximal V4 RIGHT vertebral segments are not seen, with suspected severe narrowing of these segments due to a similar dissecting aneurysm.  Fetal origin RIGHT PCA. LEFT PCA widely patent without cerebellar branch occlusion. No flow reducing lesion of the anterior or middle cerebral arteries.  No intracranial berry aneurysm.  IMPRESSION: Moderately advanced small vessel disease without acute cerebral infarction.  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 The aneurysm displays postcontrast enhancement, but has insufficient flow to be seen on MRA.  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  50% stenosis of the supraclinoid ICA on the RIGHT. Atheromatous outpouching from the cavernous segment correlates with the observed CTA findings of small medially projecting aneurysm.  If further investigation is desired, consider formal  catheter angiogram to evaluate for the need for endovascular treatment.   Electronically Signed   By: Davonna Belling M.D.   On: 03/01/2014 20:17    CXR 02/28/2014 - no acute disease  CT Head w/o 02/28/2014 - Smooth enlargement of the L carotid canal and surrounding osseous structures with dehiscence of the posterior wall sphenoid on the L. No definite acute intracranial findings.  CTA Head and Neck 02/28/2014 - 50-60% stenosis of the left vertebral artery at the foramen magnum. Extensive atherosclerotic calcification in both carotid siphon regions. On the right, there is a 3 mm medially projecting atherosclerotic aneurysm. Stenosis in the distal siphon is estimated at 70% on the right. Maximal stenosis the proximal siphon on the left is estimated at 50%. Enlargement of the distal carotid canal on the left and the left cavernous sinus region.  ASSESSMENT / PLAN:  PULMONARY OETT - none A: No acute issues P:  Continue to monitor IS  CARDIOVASCULAR CVL - none A: Hypertensive Emergency R/o cardiomyopathy, rt heart enlarged on pcxr Ras ? - not evident on renal ultrasound  P:  Cardene gtt - titrate to 25% reduction from highest MAP  = 100 today 1/6, likely further down in am  UDS negative Echo>> EF 65-70%, severe LVH, grade I diastolic dysfunction  Transition to oral medications today further Continue Clonidine 0.3 mg BID, would avoid increases as likely as oupt not a great first line drug Labetalol 5 mg IV Q4H PRN added NO ACEI Add oral hydralazine as some hr 69   RENAL A: Hx renal artery stenosis, poor historian Hyponatremia P:   Still avoid acei bmet in AM kvo Serum osm, appears euvolemic Obtain urine osm , na  Uric acid level   GASTROINTESTINAL A: No acute issues P:   Low sodium diet  HEMATOLOGIC A:  VTE ppx P:  Lovenox 40mg  daily, dc when ambulation  INFECTIOUS A:  No acute issues P:  Continue to monitor  ENDOCRINE A:  No acute issues  P:  Continue to  monitor  NEUROLOGIC A:  Fusiform LEFT dissecting int carotid art aneurysm dissection, chronic calcified anuerysm, 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 P:  No symptoms to support, no focal neuro findings on exam Neuro consulted appreciated, for ASA, 1 month follow up BP control as above Consider formal catheter angiogram per radiology - per neuro   FAMILY  - Updates: Family updated 1/5  - Inter-disciplinary family meet or Palliative Care meeting due by:  03/07/2014    TODAY'S SUMMARY: Off Cardene gtt, possibly transition to oral meds today- to tele  Rich Number, MD  Pulmonary and Critical Care Medicine [facility] Pager: [phone]  03/02/2014, 6:35 AM    STAFF NOTE: I, Rory Percy, MD FACP have personally reviewed patient's available data, including medical history, events of note, physical examination and test results as part of my evaluation. I have discussed with resident/NP and other care providers  such as pharmacist, RN and RRT. In addition, I personally evaluated patient and elicited key findings of: resolved, neuro recks noted, adding hydralazine, will need to se Dr Jacinto Halim for BP control as outpt, MRI reviewed, no RAS noted, to triad Mcarthur Rossetti. Tyson Alias, MD, FACP Pgr: [phone] [place] [facility] 03/02/2014 10:08 AM

## 2014-03-03 DIAGNOSIS — I7771 Dissection of carotid artery: Secondary | ICD-10-CM

## 2014-03-03 MED ORDER — LISINOPRIL 20 MG PO TABS
20.0000 mg | ORAL_TABLET | Freq: Every day | ORAL | Status: DC
  Filled 2014-03-03: qty 1

## 2014-03-03 MED ORDER — LISINOPRIL 5 MG PO TABS
5.0000 mg | ORAL_TABLET | Freq: Every day | ORAL | Status: DC
  Administered 2014-03-03: 5 mg via ORAL
  Filled 2014-03-03: qty 1

## 2014-03-03 MED ORDER — LISINOPRIL 2.5 MG PO TABS: 2.5000 mg | ORAL_TABLET | Freq: Every day | ORAL | Status: DC

## 2014-03-03 NOTE — Progress Notes (Signed)
Chart reviewed.   [facility] PROGRESS NOTE  Nicole Best:[id number] DOB: Aug 30, 1950 DOA: 02/28/2014 PCP: No primary care provider on file.  Assessment/Plan:  Principal Problem:   Hypertensive emergency:  Blood pressure improved, but not at goal.  No RAS on doppler.  Resume ace inhibitor. and monitor creatinine Active Problems:   Dizziness   Carotid artery dissection, chronic, ASA, bp control, neuro f/u one month   Code Status:  full Family Communication:   Disposition Plan:  Home tomorrow if stable  HPI/Subjective: Some orthostatic dizziness.  Objective: Filed Vitals:   03/03/14 0618  BP: 143/90  Pulse: 66  Temp:   Resp: 18    Intake/Output Summary (Last 24 hours) at 03/03/14 1304 Last data filed at 03/03/14 1100  Gross per 24 hour  Intake    480 ml  Output      0 ml  Net    480 ml   Filed Weights   02/28/14 1529  Weight: 68.04 kg (150 lb)    Exam:   General:  In chair. comfortable  Cardiovascular: RRR without MGR  Respiratory: CTA without WRR  Abdomen: S, NT, ND  Ext: no CCE  Basic Metabolic Panel:  Recent Labs Lab 02/28/14 1623 03/01/14 0227 03/02/14 0222  NA 137 140 134*  K 4.2 3.9 4.1  CL 104 104 101  CO2 GLUCOSE 97 100* 132*  BUN CREATININE 0.82 0.85 0.76  CALCIUM 9.6 9.8 8.7   Liver Function Tests: No results for input(s): AST, ALT, ALKPHOS, BILITOT, PROT, ALBUMIN in the last 168 hours. No results for input(s): LIPASE, AMYLASE in the last 168 hours. No results for input(s): AMMONIA in the last 168 hours. CBC:  Recent Labs Lab 02/28/14 1623 03/01/14 0227  WBC 8.1 10.1  HGB 13.2 13.9  HCT 40.2 41.5  MCV 88.5 88.1  PLT 358 318   Cardiac Enzymes: No results for input(s): CKTOTAL, CKMB, CKMBINDEX, TROPONINI in the last 168 hours. BNP (last 3 results) No results for input(s): PROBNP in the last 8760 hours. CBG:  Recent Labs Lab 02/28/14 2230  GLUCAP 119*    Recent Results (from the  past 240 hour(s))  MRSA PCR Screening     Status: None   Collection Time: 02/28/14 10:52 PM  Result Value Ref Range Status   MRSA by PCR NEGATIVE NEGATIVE Final    Comment:        The GeneXpert MRSA Assay (FDA approved for NASAL specimens only), is one component of a comprehensive MRSA colonization surveillance program. It is not intended to diagnose MRSA infection nor to guide or monitor treatment for MRSA infections.      Studies: Nicole Shirlee Latch Wo Contrast  03/01/2014   CLINICAL DATA:  Dizziness and lightheadedness beginning 02/28/2014.  EXAM: MRI HEAD WITHOUT AND WITH CONTRAST  MRA HEAD WITHOUT CONTRAST  TECHNIQUE: Multiplanar, multiecho pulse sequences of the brain and surrounding structures were obtained without and with intravenous contrast. Angiographic images of the head were obtained using MRA technique without contrast.  CONTRAST:  15mL MULTIHANCE GADOBENATE DIMEGLUMINE 529 MG/ML IV SOLN  COMPARISON:  CT head without 02/28/2014.  CT angio head 02/28/2014.  FINDINGS: MRI HEAD FINDINGS  No acute stroke or hemorrhage. No mass lesion or hydrocephalus. No extra-axial fluid. Normal for age cerebral volume. Extensive T2 and FLAIR hyperintensity throughout the periventricular and subcortical white matter representing chronic microvascular ischemic change. Remote LEFT periventricular lacunar infarct. Widespread ischemic demyelination in the brainstem, particularly  the pons related to small vessel disease.  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 There is no significant luminal narrowing. This fusiform dissecting aneurysm measures 11 x 12 mm cross-section at its mid cavernous segment as seen on image 7 series 16.  There is diminished flow related enhancement in the distal RIGHT vertebral. As seen on postcontrast images, the distal RIGHT vertebral V4 segment is  as larger or larger than the LEFT vertebral measuring 5 x 6 mm cross-section on image 4 series 16. The luminal narrowing observed on CTA and discussed further in the MRA section likely relates to a dissecting aneurysm of this vessel.  Minor sinus disease.  Negative orbits.  No osseous lesions.  MRA HEAD FINDINGS  The RIGHT ICA upper cervical and petrous segments are normal. There is minor atheromatous change in the cavernous ICA on the RIGHT with an atherosclerotic outpouching as observed with CT. RIGHT supraclinoid ICA is narrowed, approximate 50% stenosis. RIGHT ICA terminus widely patent.  No significant narrowing or irregularity of the upper cervical, petrous, cavernous, or supraclinoid ICA on the LEFT, despite the finding of fusiform dissecting petrous and cavernous aneurysm. There is insufficient flow in this aneurysm to result in signal on MRA. The supraclinoid LEFT ICA is dolichoectatic and more vertical in appearance than on the RIGHT, but there are no flow reducing lesions.  The basilar artery is markedly dolichoectatic. There is a 50-75% stenosis of the V4 segment of the LEFT vertebral which is dominant. The distal RIGHT vertebral is patent from the vertebral confluence towards the foramen magnum, and the RIGHT PICA observed as a branching vessel, but the distal V3 and proximal V4 RIGHT vertebral segments are not seen, with suspected severe narrowing of these segments due to a similar dissecting aneurysm.  Fetal origin RIGHT PCA. LEFT PCA widely patent without cerebellar branch occlusion. No flow reducing lesion of the anterior or middle cerebral arteries.  No intracranial berry aneurysm.  IMPRESSION: Moderately advanced small vessel disease without acute cerebral infarction.  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 The  aneurysm displays postcontrast enhancement, but has insufficient flow to be seen on MRA.  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  50% stenosis of the supraclinoid ICA on the RIGHT. Atheromatous outpouching from the cavernous segment correlates with the observed CTA findings of small medially projecting aneurysm.  If further investigation is desired, consider formal catheter angiogram to evaluate for the need for endovascular treatment.   Electronically Signed   By: Davonna Belling M.D.   On: 03/01/2014 20:17   Nicole Best Contrast  03/01/2014   CLINICAL DATA:  Dizziness and lightheadedness beginning 02/28/2014.  EXAM: MRI HEAD WITHOUT AND WITH CONTRAST  MRA HEAD WITHOUT CONTRAST  TECHNIQUE: Multiplanar, multiecho pulse sequences of the brain and surrounding structures were obtained without and with intravenous contrast. Angiographic images of the head were obtained using MRA technique without contrast.  CONTRAST:  15mL MULTIHANCE GADOBENATE DIMEGLUMINE 529 MG/ML IV SOLN  COMPARISON:  CT head without 02/28/2014.  CT angio head 02/28/2014.  FINDINGS: MRI HEAD FINDINGS  No acute stroke or hemorrhage. No mass lesion or hydrocephalus. No extra-axial fluid. Normal for age  cerebral volume. Extensive T2 and FLAIR hyperintensity throughout the periventricular and subcortical white matter representing chronic microvascular ischemic change. Remote LEFT periventricular lacunar infarct. Widespread ischemic demyelination in the brainstem, particularly the pons related to small vessel disease.  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 There is no significant luminal narrowing. This fusiform dissecting aneurysm measures 11 x 12 mm cross-section at its mid cavernous segment as seen on image 7 series 16.  There  is diminished flow related enhancement in the distal RIGHT vertebral. As seen on postcontrast images, the distal RIGHT vertebral V4 segment is as larger or larger than the LEFT vertebral measuring 5 x 6 mm cross-section on image 4 series 16. The luminal narrowing observed on CTA and discussed further in the MRA section likely relates to a dissecting aneurysm of this vessel.  Minor sinus disease.  Negative orbits.  No osseous lesions.  MRA HEAD FINDINGS  The RIGHT ICA upper cervical and petrous segments are normal. There is minor atheromatous change in the cavernous ICA on the RIGHT with an atherosclerotic outpouching as observed with CT. RIGHT supraclinoid ICA is narrowed, approximate 50% stenosis. RIGHT ICA terminus widely patent.  No significant narrowing or irregularity of the upper cervical, petrous, cavernous, or supraclinoid ICA on the LEFT, despite the finding of fusiform dissecting petrous and cavernous aneurysm. There is insufficient flow in this aneurysm to result in signal on MRA. The supraclinoid LEFT ICA is dolichoectatic and more vertical in appearance than on the RIGHT, but there are no flow reducing lesions.  The basilar artery is markedly dolichoectatic. There is a 50-75% stenosis of the V4 segment of the LEFT vertebral which is dominant. The distal RIGHT vertebral is patent from the vertebral confluence towards the foramen magnum, and the RIGHT PICA observed as a branching vessel, but the distal V3 and proximal V4 RIGHT vertebral segments are not seen, with suspected severe narrowing of these segments due to a similar dissecting aneurysm.  Fetal origin RIGHT PCA. LEFT PCA widely patent without cerebellar branch occlusion. No flow reducing lesion of the anterior or middle cerebral arteries.  No intracranial berry aneurysm.  IMPRESSION: Moderately advanced small vessel disease without acute cerebral infarction.  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 The aneurysm displays postcontrast enhancement, but has insufficient flow to be seen on MRA.  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  50% stenosis of the supraclinoid ICA on the RIGHT. Atheromatous outpouching from the cavernous segment correlates with the observed CTA findings of small medially projecting aneurysm.  If further investigation is desired, consider formal catheter angiogram to evaluate for the need for endovascular treatment.   Electronically Signed   By: Davonna BellingJohn  Curnes M.D.   On: 03/01/2014 20:17    Scheduled Meds: . aspirin EC  325 mg Oral Daily  . cloNIDine  0.3 mg Oral BID  . enoxaparin (LOVENOX) injection  40 mg Subcutaneous QHS  . hydrALAZINE  25 mg Oral 3 times per day  . Influenza vac split quadrivalent PF  0.5 mL Intramuscular Tomorrow-1000  . pneumococcal 23 valent vaccine  0.5 mL Intramuscular Tomorrow-1000   Continuous Infusions:   Time spent: 25 minutes  Maalle Starrett  L  [facility] [email or web address], password [facility] 03/03/2014, 1:04 PM  LOS: 3 days

## 2014-03-04 LAB — BASIC METABOLIC PANEL
Anion gap: 9 (ref 5–15)
BUN: 12 mg/dL (ref 6–23)
CHLORIDE: 106 meq/L (ref 96–112)
CO2: 23 mmol/L (ref 19–32)
CREATININE: 0.79 mg/dL (ref 0.50–1.10)
Calcium: 9.3 mg/dL (ref 8.4–10.5)
GFR calc Af Amer: >90 mL/min (ref >90)
GFR calc non Af Amer: 88 mL/min — ABNORMAL LOW (ref >90)
Glucose, Bld: 95 mg/dL (ref 70–99)
POTASSIUM: 4 mmol/L (ref 3.5–5.1)
SODIUM: 138 mmol/L (ref 135–145)

## 2014-03-04 MED ORDER — CLONIDINE HCL 0.3 MG PO TABS
0.3000 mg | ORAL_TABLET | Freq: Two times a day (BID) | ORAL | Status: DC
  Administered 2014-03-04 – 2014-03-06 (×5): 0.3 mg via ORAL
  Filled 2014-03-04 (×6): qty 1

## 2014-03-04 MED ORDER — LABETALOL HCL 100 MG PO TABS
100.0000 mg | ORAL_TABLET | Freq: Two times a day (BID) | ORAL | Status: DC
Start: 2014-03-04 — End: 2014-03-06
  Administered 2014-03-04 – 2014-03-06 (×5): 100 mg via ORAL
  Filled 2014-03-04 (×6): qty 1

## 2014-03-04 MED ORDER — SENNOSIDES-DOCUSATE SODIUM 8.6-50 MG PO TABS
2.0000 | ORAL_TABLET | Freq: Every day | ORAL | Status: DC
  Administered 2014-03-04: 2 via ORAL
  Filled 2014-03-04 (×2): qty 2

## 2014-03-04 MED ORDER — LISINOPRIL 20 MG PO TABS
20.0000 mg | ORAL_TABLET | Freq: Every day | ORAL | Status: DC
  Administered 2014-03-04: 20 mg via ORAL
  Filled 2014-03-04: qty 1

## 2014-03-04 MED ORDER — LISINOPRIL 40 MG PO TABS
40.0000 mg | ORAL_TABLET | Freq: Every day | ORAL | Status: DC
  Administered 2014-03-05 – 2014-03-06 (×2): 40 mg via ORAL
  Filled 2014-03-04 (×2): qty 1

## 2014-03-04 MED ORDER — LABETALOL HCL 5 MG/ML IV SOLN
5.0000 mg | Freq: Once | INTRAVENOUS | Status: AC
  Administered 2014-03-04: 5 mg via INTRAVENOUS
  Filled 2014-03-04: qty 4

## 2014-03-04 NOTE — Progress Notes (Signed)
[facility] PROGRESS NOTE  Nicole FinnerShirley M Best ZOX:[id number] DOB: [birth year] DOA: 02/28/2014 PCP: Nicole Best,Nicole STEWART, Nicole Best Summary 64 yo black female presented to ED with blurred vision, hypertensive emergency. Admitted to ICU on cardene gtt. Found to have chronic carotic artery dissection.  Neurology recommended ASA, blood pressure control. [facility]  Assessment/Plan:  Principal Problem:   Hypertensive emergency:  Blood pressure still high.  No RAS on doppler.  Increase lisinoprol. Continue hydralazine. Add labetolol. Not ready for discharge Active Problems:   Dizziness   Carotid artery dissection, chronic, ASA, bp control, neuro f/u one month   Code Status:  full Family Communication:   Disposition Plan:  Home when BP better  HPI/Subjective: Some orthostatic dizziness.  Improving overall  Objective: Filed Vitals:   03/04/14 1343  BP: 181/91  Pulse: 74  Temp: 98 F (36.7 C)  Resp: 16    Intake/Output Summary (Last 24 hours) at 03/04/14 1413 Last data filed at 03/04/14 1340  Gross per 24 hour  Intake    840 ml  Output      0 ml  Net    840 ml   Filed Weights   02/28/14 1529  Weight: 68.04 kg (150 lb)    Exam:   General:  In chair. comfortable  Cardiovascular: RRR without MGR  Respiratory: CTA without WRR  Abdomen: S, NT, ND  Ext: no CCE  Basic Metabolic Panel:  Recent Labs Lab 02/28/14 1623 03/01/14 0227 03/02/14 0222 03/04/14 0540  NA 137 140 134* 138  K 4.2 3.9 4.1 4.0  CL 104 104 101 106  CO2 23 27 24 23   GLUCOSE 97 100* 132* 95  BUN 10 8 10 12   CREATININE 0.82 0.85 0.76 0.79  CALCIUM 9.6 9.8 8.7 9.3   Liver Function Tests: No results for input(s): AST, ALT, ALKPHOS, BILITOT, PROT, ALBUMIN in the last 168 hours. No results for input(s): LIPASE, AMYLASE in the last 168 hours. No results for input(s): AMMONIA in the last 168 hours. CBC:  Recent Labs Lab 02/28/14 1623 03/01/14 0227  WBC 8.1 10.1  HGB 13.2  13.9  HCT 40.2 41.5  MCV 88.5 88.1  PLT 358 318   Cardiac Enzymes: No results for input(s): CKTOTAL, CKMB, CKMBINDEX, TROPONINI in the last 168 hours. BNP (last 3 results) No results for input(s): PROBNP in the last 8760 hours. CBG:  Recent Labs Lab 02/28/14 2230  GLUCAP 119*    Recent Results (from the past 240 hour(s))  MRSA PCR Screening     Status: None   Collection Time: 02/28/14 10:52 PM  Result Value Ref Range Status   MRSA by PCR NEGATIVE NEGATIVE Final    Comment:        The GeneXpert MRSA Assay (FDA approved for NASAL specimens only), is one component of a comprehensive MRSA colonization surveillance program. It is not intended to diagnose MRSA infection nor to guide or monitor treatment for MRSA infections.      Studies: No results found.  Scheduled Meds: . aspirin EC  325 mg Oral Daily  . cloNIDine  0.3 mg Oral BID  . enoxaparin (LOVENOX) injection  40 mg Subcutaneous QHS  . hydrALAZINE  25 mg Oral 3 times per day  . Influenza vac split quadrivalent PF  0.5 mL Intramuscular Tomorrow-1000  . labetalol  100 mg Oral BID  . [START ON 03/05/2014] lisinopril  40 mg Oral Daily  . pneumococcal 23 valent vaccine  0.5 mL Intramuscular Tomorrow-1000   Continuous Infusions:  Time spent: 25 minutes  Nicole Best L  [facility] [email or web address], password [facility] 03/04/2014, 2:13 PM  LOS: 4 days

## 2014-03-05 MED ORDER — HYDRALAZINE HCL 50 MG PO TABS: 50.0000 mg | ORAL_TABLET | Freq: Three times a day (TID) | ORAL | Status: DC

## 2014-03-05 MED ORDER — HYDRALAZINE HCL 25 MG PO TABS
25.0000 mg | ORAL_TABLET | Freq: Three times a day (TID) | ORAL | Status: DC
  Administered 2014-03-05 – 2014-03-06 (×3): 25 mg via ORAL
  Filled 2014-03-05 (×6): qty 1

## 2014-03-05 MED ORDER — AMLODIPINE BESYLATE 10 MG PO TABS
10.0000 mg | ORAL_TABLET | Freq: Every day | ORAL | Status: DC
  Administered 2014-03-05 – 2014-03-06 (×2): 10 mg via ORAL
  Filled 2014-03-05 (×2): qty 1

## 2014-03-05 NOTE — Progress Notes (Signed)
[facility] PROGRESS NOTE  QUANNA WITTKE ZOX:[id number] DOB: 1950-05-31 DOA: 02/28/2014 PCP: Gaye Alken, MD Summary 64 yo black female presented to ED with blurred vision, hypertensive emergency. Admitted to ICU on cardene gtt. Found to have chronic carotic artery dissection.  Neurology recommended ASA, blood pressure control. Transferred to [facility] 1/7  Assessment/Plan:     Hypertensive emergency:  Blood pressure still high.  No RAS on doppler.  Increase lisinoprol. Continue hydralazine. Add labetolol. Add norvasc-- may need hydralazine increase tomm    Dizziness- resolved- up walking around   Carotid artery dissection, chronic, ASA, bp control, neuro f/u one month   Code Status:  full Family Communication:   Disposition Plan:  Home when BP better- hope sunday  HPI/Subjective: Feeling well, no complaints- anxious to go home  Objective: Filed Vitals:   03/05/14 0619  BP: 179/106  Pulse: 73  Temp: 98.2 F (36.8 C)  Resp: 18    Intake/Output Summary (Last 24 hours) at 03/05/14 1018 Last data filed at 03/04/14 1340  Gross per 24 hour  Intake    240 ml  Output      0 ml  Net    240 ml   Filed Weights   02/28/14 1529  Weight: 68.04 kg (150 lb)    Exam:   General:  In chair. comfortable  Cardiovascular: RRR without MGR  Respiratory: CTA without WRR  Abdomen: S, NT, ND  Ext: no CCE  Basic Metabolic Panel:  Recent Labs Lab 02/28/14 1623 03/01/14 0227 03/02/14 0222 03/04/14 0540  NA 137 140 134* 138  K 4.2 3.9 4.1 4.0  CL 104 104 101 106  CO2 GLUCOSE 97 100* 132* 95  BUN CREATININE 0.82 0.85 0.76 0.79  CALCIUM 9.6 9.8 8.7 9.3   Liver Function Tests: No results for input(s): AST, ALT, ALKPHOS, BILITOT, PROT, ALBUMIN in the last 168 hours. No results for input(s): LIPASE, AMYLASE in the last 168 hours. No results for input(s): AMMONIA in the last 168 hours. CBC:  Recent Labs Lab 02/28/14 1623  03/01/14 0227  WBC 8.1 10.1  HGB 13.2 13.9  HCT 40.2 41.5  MCV 88.5 88.1  PLT 358 318   Cardiac Enzymes: No results for input(s): CKTOTAL, CKMB, CKMBINDEX, TROPONINI in the last 168 hours. BNP (last 3 results) No results for input(s): PROBNP in the last 8760 hours. CBG:  Recent Labs Lab 02/28/14 2230  GLUCAP 119*    Recent Results (from the past 240 hour(s))  MRSA PCR Screening     Status: None   Collection Time: 02/28/14 10:52 PM  Result Value Ref Range Status   MRSA by PCR NEGATIVE NEGATIVE Final    Comment:        The GeneXpert MRSA Assay (FDA approved for NASAL specimens only), is one component of a comprehensive MRSA colonization surveillance program. It is not intended to diagnose MRSA infection nor to guide or monitor treatment for MRSA infections.      Studies: No results found.  Scheduled Meds: . aspirin EC  325 mg Oral Daily  . cloNIDine  0.3 mg Oral BID  . enoxaparin (LOVENOX) injection  40 mg Subcutaneous QHS  . hydrALAZINE  50 mg Oral 3 times per day  . labetalol  100 mg Oral BID  . lisinopril  40 mg Oral Daily  . pneumococcal 23 valent vaccine  0.5 mL Intramuscular Tomorrow-1000  . senna-docusate  2 tablet Oral Daily  Continuous Infusions:   Time spent: 25 minutes  Keiasha Diep  [facility] [email or web address], password [email or web address] 03/05/2014, 10:18 AM  LOS: 5 days

## 2014-03-06 MED ORDER — HYDRALAZINE HCL 25 MG PO TABS: 25.0000 mg | ORAL_TABLET | Freq: Three times a day (TID) | ORAL | Status: AC

## 2014-03-06 MED ORDER — AMLODIPINE BESYLATE 10 MG PO TABS: 10.0000 mg | ORAL_TABLET | Freq: Every day | ORAL | Status: DC

## 2014-03-06 MED ORDER — LISINOPRIL 40 MG PO TABS: 40.0000 mg | ORAL_TABLET | Freq: Every day | ORAL | Status: DC

## 2014-03-06 MED ORDER — LABETALOL HCL 100 MG PO TABS: 100.0000 mg | ORAL_TABLET | Freq: Two times a day (BID) | ORAL | Status: DC

## 2014-03-06 MED ORDER — BD ASSURE BPM/DELUXE ARM CUFF MISC: 1.0000 [IU] | Status: AC | PRN

## 2014-03-06 NOTE — Discharge Summary (Signed)
Physician Discharge Summary  TANJA GIFT ZOX:[id number] DOB: Jan 21, 1951 DOA: 02/28/2014  PCP: Gaye Alken, MD  Admit date: 02/28/2014 Discharge date: 03/06/2014  Time spent: 35 minutes  Recommendations for Outpatient Follow-up:  1. Check BP at home and bring to PCP 2. Bmp 1 week  Discharge Diagnoses:  Principal Problem:   Hypertensive emergency Active Problems:   Dizziness   Carotid artery dissection, chronic   Discharge Condition: improved  Diet recommendation: cardiac  Filed Weights   02/28/14 1529  Weight: 68.04 kg (150 lb)    History of present illness:  Female with dizziness and found to have BP 188/113 at PCP office. Her dizziness started around 3AM upon standing from supine position. She sat back down which helped relieve the dizziness. Upon standing again she felt lightheaded. Denies changes in diet, weight change, vision changes, chest pain, SOB, N/V, abdominal pain, diarrhea, dysuria, paresthesias, weakness, headache. She checks her BP 2-3x/week and last checked Saturday was around her baseline - 152/90. Reports compliance of her clonidine 0.3mg  BID and lisinopril 2.5mg  daily. She went to her PCP office and was found to be hypertensive to 188/113. She was transferred to the ED. Upon presentation to the ED, she was found to have BP 215/149. Started on esmolol gtt in ED.   MRA abdomen 05/25/2002 with weblike narrowing in distal right main renal artery suggestive of fibromuscular dysplasia. Abdominal aortogram and selective R and L renal arteriography 12/29/2006 by Dr. Jacinto Halim with no evidence of renal artery stenosis  Hospital Course:  Hypertensive emergency: better controlled on multiple medications -will get BP cuff for home  Dizziness- resolved- up walking around  Carotid artery dissection, chronic, ASA, bp control, neuro f/u one month  Procedures:    Consultations: PCCM Neuro cards  Discharge Exam: Filed Vitals:   03/06/14 0912  BP: 144/90   Pulse:   Temp:   Resp:     General: A+Ox3, NAD Cardiovascular: rrr Respiratory: clear  Discharge Instructions   Discharge Instructions    Diet - low sodium heart healthy    Complete by:  As directed      Discharge instructions    Complete by:  As directed   Check BP at home, bring to PCP     Increase activity slowly    Complete by:  As directed           Current Discharge Medication List    START taking these medications   Details  amLODipine (NORVASC) 10 MG tablet Take 1 tablet (10 mg total) by mouth daily. Qty: 30 tablet, Refills: 0    Blood Pressure Monitoring (B-D ASSURE BPM/DELUXE ARM CUFF) MISC 1 Units by Does not apply route as needed. Qty: 1 each, Refills: 0    hydrALAZINE (APRESOLINE) 25 MG tablet Take 1 tablet (25 mg total) by mouth every 8 (eight) hours. Qty: 90 tablet, Refills: 0    labetalol (NORMODYNE) 100 MG tablet Take 1 tablet (100 mg total) by mouth 2 (two) times daily. Qty: 60 tablet, Refills: 0      CONTINUE these medications which have CHANGED   Details  lisinopril (PRINIVIL,ZESTRIL) 40 MG tablet Take 1 tablet (40 mg total) by mouth daily. Qty: 30 tablet, Refills: 0      CONTINUE these medications which have NOT CHANGED   Details  aspirin 325 MG tablet Take 325 mg by mouth See admin instructions. Only take twice a week no specific days    cloNIDine (CATAPRES) 0.3 MG tablet Take 0.3 mg by mouth 2 (  two) times daily.    fluticasone (FLONASE) 50 MCG/ACT nasal spray Place 1 spray into both nostrils daily as needed for allergies or rhinitis.       No Known Allergies Follow-up Information    Follow up with Gaye Alken, MD In 1 week.   Specialty:  Family Medicine   Why:  BMp, BP check   Contact information:   [address] [place] [address]       Follow up with [facility].   Specialty:  Neurology   Contact information:   [address] [place], [address] [place]  [id number] [phone]      Follow up with Pamella Pert, MD In 1 month.   Specialty:  Cardiology   Contact information:   [address] [place] [address] [phone]        The results of significant diagnostics from this hospitalization (including imaging, microbiology, ancillary and laboratory) are listed below for reference.    Significant Diagnostic Studies: Ct Angio Head W/cm &/or Wo Cm  02/28/2014   CLINICAL DATA:  Hypertension.  Dizziness and lightheaded.  EXAM: CT ANGIOGRAPHY HEAD  TECHNIQUE: Multidetector CT imaging of the head was performed using the standard protocol during bolus administration of intravenous contrast. Multiplanar CT image reconstructions and MIPs were obtained to evaluate the vascular anatomy.  CONTRAST:  50mL OMNIPAQUE IOHEXOL 350 MG/ML SOLN  COMPARISON:  None.  FINDINGS: CT HEAD  No acute stroke is seen. There are areas of low density in the hemispheric white matter likely representing chronic small-vessel ischemic changes. No cortical stroke. No mass lesion, hemorrhage, hydrocephalus or extra-axial collection. No abnormal contrast enhancement seen.  CTA HEAD  Anterior circulation: Both internal carotid arteries are widely patent through the skullbase. There is calcification of the carotid artery walls in the carotid siphon regions. There is no atherosclerotic 3 mm aneurysm projecting medially from the carotid siphon on the right. Stenosis in the distal siphon on the right approaches 70%. Both anterior and middle cerebral vessels are widely patent without stenosis, aneurysm or vascular malformation.  Posterior circulation:  The left vertebral artery is dominant. There is atherosclerotic calcification at the foramen magnum with narrowing of the left vertebral artery by 50-60%. The basilar artery shows mild atherosclerotic irregularity and is tortuous but there is no basilar stenosis. Posterior circulation branch vessels are patent. No posterior  circulation aneurysm.  There is enlargement of the distal carotid canal on the left and enlargement of the cavernous sinus region, filled by a low density material (30 Hounsfield units). There is no sign of peripheral calcification to suggest pseudo aneurysm.  Venous sinuses: Widely patent  IMPRESSION: Advanced atherosclerotic vascular disease.  50-60% stenosis of the left vertebral artery at the foramen magnum. This is the dominant vessel.  Extensive atherosclerotic calcification in both carotid siphon regions. On the right, there is a 3 mm medially projecting atherosclerotic aneurysm. Stenosis in the distal siphon is estimated at 70% on the right. Maximal stenosis the proximal siphon on the left is estimated at 50%.  Enlargement of the distal carotid canal on the left and the left cavernous sinus region. I do not think this likely relates to a carotid aneurysm. Conceivably, chronic dissection with a chronically thrombosis pseudo aneurysm could give this appearance, but I do not favor that. This could be a congenital chronic finding. I considered the possibility of a schwannoma resulting in this appearance. I think MRI, intracranial MR angiography, and postcontrast MRI will be  necessary to fully understand this appearance.   Electronically Signed   By: Paulina Fusi M.D.   On: 02/28/2014 18:45   Ct Head Wo Contrast  02/28/2014   CLINICAL DATA:  Dizziness and lightheadedness. This began earlier today. Initial encounter.  EXAM: CT HEAD WITHOUT CONTRAST  TECHNIQUE: Contiguous axial images were obtained from the base of the skull through the vertex without intravenous contrast.  COMPARISON:  None.  FINDINGS: Dolichoectatic and calcified cerebral vasculature, particularly the vertebrobasilar system where there is calcification involving the dominant LEFT vertebral as well as the mid basilar artery. Carotid siphons are also heavily calcified.  There is smooth enlargement of the LEFT carotid canal and surrounding osseous  structures, including the posterior wall of the LEFT sphenoid sinus and medial sella. It is unclear if this is due to dolichoectasia (more likely) or carotid aneurysm. Consider CTA head/neck for further evaluation if clinically indicated.  No evidence for acute infarction, hemorrhage, mass lesion, hydrocephalus, or extra-axial fluid. Normal cerebral volume. Slight hypoattenuation of white matter, could represent early small vessel disease.  The calvarium is intact. There is no acute sinus or mastoid disease.  IMPRESSION: Smooth enlargement of the LEFT carotid canal and surrounding osseous structures with dehiscence of the posterior wall sphenoid on the LEFT. Likely extreme dolichoectasia but carotid aneurysm not excluded. Consider CTA head/neck for further evaluation. See discussion above  Posterior circulation also calcified and significantly dolichoectatic.  No definite acute intracranial findings. Normal cerebral volume with hypoattenuation white matter, likely chronic microvascular ischemic change. No evidence for cortical infarction or CT signs of proximal vascular thrombosis.   Electronically Signed   By: Davonna Belling M.D.   On: 02/28/2014 16:12   Mr Maxine Glenn Head Wo Contrast  03/01/2014   CLINICAL DATA:  Dizziness and lightheadedness beginning 02/28/2014.  EXAM: MRI HEAD WITHOUT AND WITH CONTRAST  MRA HEAD WITHOUT CONTRAST  TECHNIQUE: Multiplanar, multiecho pulse sequences of the brain and surrounding structures were obtained without and with intravenous contrast. Angiographic images of the head were obtained using MRA technique without contrast.  CONTRAST:  15mL MULTIHANCE GADOBENATE DIMEGLUMINE 529 MG/ML IV SOLN  COMPARISON:  CT head without 02/28/2014.  CT angio head 02/28/2014.  FINDINGS: MRI HEAD FINDINGS  No acute stroke or hemorrhage. No mass lesion or hydrocephalus. No extra-axial fluid. Normal for age cerebral volume. Extensive T2 and FLAIR hyperintensity throughout the periventricular and subcortical  white matter representing chronic microvascular ischemic change. Remote LEFT periventricular lacunar infarct. Widespread ischemic demyelination in the brainstem, particularly the pons related to small vessel disease.  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 There is no significant luminal narrowing. This fusiform dissecting aneurysm measures 11 x 12 mm cross-section at its mid cavernous segment as seen on image 7 series 16.  There is diminished flow related enhancement in the distal RIGHT vertebral. As seen on postcontrast images, the distal RIGHT vertebral V4 segment is as larger or larger than the LEFT vertebral measuring 5 x 6 mm cross-section on image 4 series 16. The luminal narrowing observed on CTA and discussed further in the MRA section likely relates to a dissecting aneurysm of this vessel.  Minor sinus disease.  Negative orbits.  No osseous lesions.  MRA HEAD FINDINGS  The RIGHT ICA upper cervical and petrous segments are normal. There is minor atheromatous change in the cavernous ICA on the RIGHT with an atherosclerotic outpouching  as observed with CT. RIGHT supraclinoid ICA is narrowed, approximate 50% stenosis. RIGHT ICA terminus widely patent.  No significant narrowing or irregularity of the upper cervical, petrous, cavernous, or supraclinoid ICA on the LEFT, despite the finding of fusiform dissecting petrous and cavernous aneurysm. There is insufficient flow in this aneurysm to result in signal on MRA. The supraclinoid LEFT ICA is dolichoectatic and more vertical in appearance than on the RIGHT, but there are no flow reducing lesions.  The basilar artery is markedly dolichoectatic. There is a 50-75% stenosis of the V4 segment of the LEFT vertebral which is dominant. The distal RIGHT vertebral is patent from the vertebral confluence towards the foramen magnum,  and the RIGHT PICA observed as a branching vessel, but the distal V3 and proximal V4 RIGHT vertebral segments are not seen, with suspected severe narrowing of these segments due to a similar dissecting aneurysm.  Fetal origin RIGHT PCA. LEFT PCA widely patent without cerebellar branch occlusion. No flow reducing lesion of the anterior or middle cerebral arteries.  No intracranial berry aneurysm.  IMPRESSION: Moderately advanced small vessel disease without acute cerebral infarction.  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 The aneurysm displays postcontrast enhancement, but has insufficient flow to be seen on MRA.  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  50% stenosis of the supraclinoid ICA on the RIGHT. Atheromatous outpouching from the cavernous segment correlates with the observed CTA findings of small medially projecting aneurysm.  If further investigation is desired, consider formal catheter angiogram to evaluate for the need for endovascular treatment.   Electronically Signed   By: Davonna BellingJohn  Curnes M.D.   On: 03/01/2014 20:17   Mr Laqueta JeanBrain W UJWo Contrast  03/01/2014   CLINICAL DATA:  Dizziness and lightheadedness beginning 02/28/2014.  EXAM: MRI HEAD WITHOUT AND WITH CONTRAST  MRA HEAD WITHOUT CONTRAST  TECHNIQUE: Multiplanar, multiecho pulse sequences of the brain and surrounding structures were obtained without and with intravenous contrast. Angiographic images of the head were obtained using MRA technique without contrast.  CONTRAST:  15mL MULTIHANCE GADOBENATE DIMEGLUMINE 529 MG/ML IV SOLN  COMPARISON:  CT head without 02/28/2014.  CT angio head 02/28/2014.  FINDINGS: MRI HEAD FINDINGS  No acute stroke or hemorrhage. No mass lesion or  hydrocephalus. No extra-axial fluid. Normal for age cerebral volume. Extensive T2 and FLAIR hyperintensity throughout the periventricular and subcortical white matter representing chronic microvascular ischemic change. Remote LEFT periventricular lacunar infarct. Widespread ischemic demyelination in the brainstem, particularly the pons related to small vessel disease.  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 There is no significant luminal narrowing. This fusiform dissecting aneurysm measures 11 x 12 mm cross-section at its mid cavernous segment as seen on image 7 series 16.  There is diminished flow related enhancement in the distal RIGHT vertebral. As seen on postcontrast images, the distal RIGHT vertebral V4 segment is as larger or larger than the LEFT vertebral measuring 5 x 6 mm cross-section on image 4 series 16. The luminal narrowing observed on CTA and discussed further in the MRA section likely relates to a dissecting aneurysm of this vessel.  Minor sinus disease.  Negative orbits.  No osseous lesions.  MRA HEAD FINDINGS  The RIGHT ICA upper cervical and petrous segments are normal. There is minor atheromatous change in the cavernous ICA on the RIGHT with an atherosclerotic outpouching as observed with CT. RIGHT supraclinoid ICA is narrowed, approximate 50% stenosis. RIGHT ICA terminus widely patent.  No significant narrowing or irregularity of the upper cervical, petrous, cavernous, or supraclinoid ICA on the LEFT, despite the finding of fusiform dissecting petrous and cavernous aneurysm. There is insufficient flow in this aneurysm to result in signal on MRA. The supraclinoid LEFT ICA is dolichoectatic and more vertical in appearance than on the RIGHT, but there are no flow reducing lesions.  The basilar artery is markedly dolichoectatic. There is a 50-75% stenosis  of the V4 segment of the LEFT vertebral which is dominant. The distal RIGHT vertebral is patent from the vertebral confluence towards the foramen magnum, and the RIGHT PICA observed as a branching vessel, but the distal V3 and proximal V4 RIGHT vertebral segments are not seen, with suspected severe narrowing of these segments due to a similar dissecting aneurysm.  Fetal origin RIGHT PCA. LEFT PCA widely patent without cerebellar branch occlusion. No flow reducing lesion of the anterior or middle cerebral arteries.  No intracranial berry aneurysm.  IMPRESSION: Moderately advanced small vessel disease without acute cerebral infarction.  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 The aneurysm displays postcontrast enhancement, but has insufficient flow to be seen on MRA.  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  50% stenosis of the supraclinoid ICA on the RIGHT. Atheromatous outpouching from the cavernous segment correlates with the observed CTA findings of small medially projecting aneurysm.  If further investigation is desired, consider formal catheter angiogram to evaluate for the need for endovascular treatment.   Electronically Signed   By: Davonna Belling M.D.   On: 03/01/2014 20:17   Dg Chest Portable 1 View  02/28/2014   CLINICAL DATA:  Hypertension for 1 day.  EXAM: PORTABLE CHEST - 1 VIEW  COMPARISON:  PA and lateral chest 12/25/2006.  FINDINGS: Heart size is upper normal. The lungs are clear. There is no pneumothorax or pleural effusion.  IMPRESSION: No acute disease.   Electronically Signed   By: Drusilla Kanner M.D.   On: 02/28/2014 15:51    Microbiology: Recent Results (from the past 240 hour(s))  MRSA PCR Screening     Status: None    Collection Time: 02/28/14 10:52 PM  Result Value Ref Range Status   MRSA by PCR NEGATIVE NEGATIVE Final    Comment:        The GeneXpert MRSA Assay (FDA approved for NASAL specimens only), is one component of a comprehensive MRSA colonization surveillance program. It is not intended to diagnose MRSA infection nor to guide or monitor treatment for MRSA infections.      Labs: Basic Metabolic Panel:  Recent Labs Lab 02/28/14 1623 03/01/14 0227 03/02/14 0222 03/04/14 0540  NA 137 140 134* 138  K 4.2 3.9 4.1 4.0  CL 104 104 101 106  CO2 23 27 24 23   GLUCOSE 97 100* 132* 95  BUN 10 8 10 12   CREATININE 0.82 0.85 0.76 0.79  CALCIUM 9.6 9.8 8.7 9.3   Liver Function Tests: No results for input(s): AST, ALT, ALKPHOS, BILITOT, PROT, ALBUMIN in the  last 168 hours. No results for input(s): LIPASE, AMYLASE in the last 168 hours. No results for input(s): AMMONIA in the last 168 hours. CBC:  Recent Labs Lab 02/28/14 1623 03/01/14 0227  WBC 8.1 10.1  HGB 13.2 13.9  HCT 40.2 41.5  MCV 88.5 88.1  PLT 358 318   Cardiac Enzymes: No results for input(s): CKTOTAL, CKMB, CKMBINDEX, TROPONINI in the last 168 hours. BNP: BNP (last 3 results) No results for input(s): PROBNP in the last 8760 hours. CBG:  Recent Labs Lab 02/28/14 2230  GLUCAP 119*       Signed:  Tyrell Brereton  [facility] 03/06/2014, 10:08 AM

## 2014-03-06 NOTE — Progress Notes (Signed)
Daine GravelShirley M Rigdon to be D/C'd Home per MD order.  Discussed with the patient and all questions fully answered.    Medication List    TAKE these medications        amLODipine 10 MG tablet  Commonly known as:  NORVASC  Take 1 tablet (10 mg total) by mouth daily.     aspirin 325 MG tablet  Take 325 mg by mouth See admin instructions. Only take twice a week no specific days     B-D ASSURE BPM/DELUXE ARM CUFF Misc  1 Units by Does not apply route as needed.     cloNIDine 0.3 MG tablet  Commonly known as:  CATAPRES  Take 0.3 mg by mouth 2 (two) times daily.     fluticasone 50 MCG/ACT nasal spray  Commonly known as:  FLONASE  Place 1 spray into both nostrils daily as needed for allergies or rhinitis.     hydrALAZINE 25 MG tablet  Commonly known as:  APRESOLINE  Take 1 tablet (25 mg total) by mouth every 8 (eight) hours.     labetalol 100 MG tablet  Commonly known as:  NORMODYNE  Take 1 tablet (100 mg total) by mouth 2 (two) times daily.     lisinopril 40 MG tablet  Commonly known as:  PRINIVIL,ZESTRIL  Take 1 tablet (40 mg total) by mouth daily.        VVS, Skin clean, dry and intact without evidence of skin break down, no evidence of skin tears noted. IV catheter discontinued intact. Site without signs and symptoms of complications. Dressing and pressure applied.  An After Visit Summary was printed and given to the patient.  D/c education completed with patient/family including follow up instructions, medication list, d/c activities limitations if indicated, with other d/c instructions as indicated by MD - patient able to verbalize understanding, all questions fully answered.   Patient instructed to return to ED, call 911, or call MD for any changes in condition.   Patient escorted via WC, and D/C home via private auto.  Mayar Whittier D 03/06/2014 11:20 AM

## 2014-12-09 ENCOUNTER — Other Ambulatory Visit: Payer: Self-pay

## 2014-12-09 DIAGNOSIS — Z1231 Encounter for screening mammogram for malignant neoplasm of breast: Secondary | ICD-10-CM

## 2015-01-02 ENCOUNTER — Ambulatory Visit
Admission: RE | Admit: 2015-01-02 | Discharge: 2015-01-02 | Disposition: A | Discharge status: Home / Self Care | Payer: Commercial Managed Care - PPO | Source: Ambulatory Visit

## 2015-01-02 DIAGNOSIS — Z1231 Encounter for screening mammogram for malignant neoplasm of breast: Secondary | ICD-10-CM

## 2015-02-09 ENCOUNTER — Other Ambulatory Visit (HOSPITAL_COMMUNITY): Payer: Self-pay | Admitting: Otolaryngology

## 2015-02-09 ENCOUNTER — Ambulatory Visit (HOSPITAL_COMMUNITY): Payer: Commercial Managed Care - PPO

## 2015-02-09 DIAGNOSIS — Z8679 Personal history of other diseases of the circulatory system: Secondary | ICD-10-CM

## 2015-02-13 ENCOUNTER — Other Ambulatory Visit (HOSPITAL_COMMUNITY): Payer: Self-pay | Admitting: Otolaryngology

## 2015-02-13 ENCOUNTER — Ambulatory Visit (HOSPITAL_COMMUNITY)
Admission: RE | Admit: 2015-02-13 | Discharge: 2015-02-13 | Disposition: A | Discharge status: Home / Self Care | Payer: Commercial Managed Care - PPO | Source: Ambulatory Visit | Attending: Otolaryngology | Admitting: Otolaryngology

## 2015-02-13 DIAGNOSIS — I6521 Occlusion and stenosis of right carotid artery: Secondary | ICD-10-CM | POA: Diagnosis not present

## 2015-02-13 DIAGNOSIS — Z8679 Personal history of other diseases of the circulatory system: Secondary | ICD-10-CM

## 2015-03-21 ENCOUNTER — Ambulatory Visit: Payer: Commercial Managed Care - PPO | Admitting: Neurology

## 2015-04-05 ENCOUNTER — Ambulatory Visit (INDEPENDENT_AMBULATORY_CARE_PROVIDER_SITE_OTHER): Payer: Commercial Managed Care - PPO | Admitting: Neurology

## 2015-04-05 ENCOUNTER — Encounter: Payer: Self-pay | Admitting: Neurology

## 2015-04-05 VITALS — BP 122/64 | HR 86 | Ht 67.5 in | Wt 169.0 lb

## 2015-04-05 DIAGNOSIS — H905 Unspecified sensorineural hearing loss: Secondary | ICD-10-CM | POA: Diagnosis not present

## 2015-04-05 DIAGNOSIS — I671 Cerebral aneurysm, nonruptured: Secondary | ICD-10-CM

## 2015-04-05 DIAGNOSIS — I10 Essential (primary) hypertension: Secondary | ICD-10-CM | POA: Diagnosis not present

## 2015-04-05 DIAGNOSIS — I7771 Dissection of carotid artery: Secondary | ICD-10-CM

## 2015-04-05 DIAGNOSIS — H9311 Tinnitus, right ear: Secondary | ICD-10-CM

## 2015-04-05 DIAGNOSIS — I679 Cerebrovascular disease, unspecified: Secondary | ICD-10-CM | POA: Diagnosis not present

## 2015-04-05 NOTE — Progress Notes (Signed)
Chart forwarded.  

## 2015-04-05 NOTE — Patient Instructions (Signed)
You have a small aneurysm that I think will need to be monitored.  I want to repeat MRA of head in one year.  If still stable, then it can be repeated every 5 years after that.  Otherwise, there is evidence of a probable OLD dissection of the carotid artery.  I don't think any of these findings are contributing to the ringing and hearing loss, however.  Continue aspirin  daily Have Dr. Zachery Dauer send the cholesterol test over to my office. Follow up in one year after repeat MRA

## 2015-04-05 NOTE — Progress Notes (Signed)
NEUROLOGY CONSULTATION NOTE  Nicole Best MRN: [id number] DOB: 04-09-1950  Referring provider: Dr. Haroldine Laws Primary care provider: Dr. Zachery Dauer  Reason for consult:  Hearing loss, cerebrovascular disease, chronic intracranial carotid dissection, cerebral aneurysm  HISTORY OF PRESENT ILLNESS: Nicole Best is a 65 year old right-handed female with hypertension, tobacco abuse and renal artery stenosis status post angioplasty who presents for hearing loss.  History obtained by patient, Dr. Allayne Stack note and hospital records.  Imaging of CT/CTA head and neck and MRI/MRA of head reviewed and discussed with radiologist.  She was admitted to [facility] in January 2016 for hypertensive emergency after waking up with dizziness and blurred vision.  Her systolic blood pressure was 230.  Blood pressure was controlled with esmolol drip and symptoms improved.   CTA of head and neck showed cerebrovascular disease with bilateral stenosis of the carotid siphon, 50-60% left vertebral artery, 3 mm aneurysm projecting medially from the carotid siphon on the right, and enlargement of the left ICA at the carotid canal and cavernous sinus region, suggesting chronic dissection with thromboses, pseudoaneurysm or schwannoma.  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  The right vertebral artery also demonstrated severe narrowing of the distal V3 and proximal V4 segments with possible dissecting aneurysm.  Fasting lipid panel revealed total cholesterol of 115, triglycerides 73, HDL 41 and LDL 59.  She was discharged on ASA 325mg  and blood pressure control.  She was supposed to follow up with neurology but never went.  In April, she began experiencing right sided hearing loss and tinnitus.  She saw ENT, Dr. Haroldine Laws, in December.  Audiogram showed 100% discrimination score bilaterally, but did demonstrate  right sensorineural hearing loss with 30 decibel speech reception threshold (10 decibel on the left).  Repeat MRA at that time showed no significant changes (atherosclerotic disease of the right carotid siphon with 3 mm medially projecting aneurysm and 50-70% stenosis of supraclinoid ICA, chronically enlarged carotid canal and stenoses of both vertebral arteries (minimal antegrade flow on right, 50% stenosis of distal left).  The right vertebral artery actually appears to be more patent than last year, which may be technical.  No aneurysm here is appreciated.  Superior cerebellar arteries, PCAs and basilar artery are patent.  PAST MEDICAL HISTORY: Past Medical History  Diagnosis Date  . Hypertension   . Renal disorder     PAST SURGICAL HISTORY: Past Surgical History  Procedure Laterality Date  . Colonoscopy    . Renal artery angioplasty    . Vaginal hysterectomy      MEDICATIONS: Current Outpatient Prescriptions on File Prior to Visit  Medication Sig Dispense Refill  . amLODipine (NORVASC) 10 MG tablet Take 1 tablet (10 mg total) by mouth daily. 30 tablet 0  . aspirin 325 MG tablet Take 325 mg by mouth See admin instructions. Only take twice a week no specific days    . Blood Pressure Monitoring (B-D ASSURE BPM/DELUXE ARM CUFF) MISC 1 Units by Does not apply route as needed. 1 each 0  . cloNIDine (CATAPRES) 0.3 MG tablet Take 0.3 mg by mouth 2 (two) times daily.    . fluticasone (FLONASE) 50 MCG/ACT nasal spray Place 1 spray into both nostrils daily as needed for allergies or rhinitis.    . hydrALAZINE (APRESOLINE) 25 MG tablet Take 1 tablet (25 mg total) by mouth every 8 (eight) hours. 90 tablet 0  .  labetalol (NORMODYNE) 100 MG tablet Take 1 tablet (100 mg total) by mouth 2 (two) times daily. 60 tablet 0   No current facility-administered medications on file prior to visit.    ALLERGIES: No Known Allergies  FAMILY HISTORY: Family History  Problem Relation Age of Onset  .  Hypertension Mother   . Hypertension Father     SOCIAL HISTORY: Social History   Social History  . Marital Status: Widowed    Spouse Name: N/A  . Number of Children: N/A  . Years of Education: N/A   Occupational History  . Not on file.   Social History Main Topics  . Smoking status: Former Smoker -- 0.50 packs/day    Types: Cigarettes    Quit date: 02/28/2014  . Smokeless tobacco: Not on file  . Alcohol Use: No  . Drug Use: No  . Sexual Activity: Not Currently   Other Topics Concern  . Not on file   Social History Narrative    REVIEW OF SYSTEMS: Constitutional: No fevers, chills, or sweats, no generalized fatigue, change in appetite Eyes: No visual changes, double vision, eye pain Ear, nose and throat: No hearing loss, ear pain, nasal congestion, sore throat Cardiovascular: No chest pain, palpitations Respiratory:  No shortness of breath at rest or with exertion, wheezes GastrointestinaI: No nausea, vomiting, diarrhea, abdominal pain, fecal incontinence Genitourinary:  No dysuria, urinary retention or frequency Musculoskeletal:  No neck pain, back pain Integumentary: No rash, pruritus, skin lesions Neurological: as above Psychiatric: No depression, insomnia, anxiety Endocrine: No palpitations, fatigue, diaphoresis, mood swings, change in appetite, change in weight, increased thirst Hematologic/Lymphatic:  No anemia, purpura, petechiae. Allergic/Immunologic: no itchy/runny eyes, nasal congestion, recent allergic reactions, rashes  PHYSICAL EXAM: Filed Vitals:   04/05/15 1021  BP: 122/64  Pulse: 86   General: No acute distress.  Patient appears well-groomed.  Head:  Normocephalic/atraumatic Eyes:  fundi unremarkable, without vessel changes, exudates, hemorrhages or papilledema. Neck: supple, no paraspinal tenderness, full range of motion Back: No paraspinal tenderness Heart: regular rate and rhythm Lungs: Clear to auscultation bilaterally. Vascular: No  carotid bruits. Neurological Exam: Mental status: alert and oriented to person, place, and time, recent and remote memory intact, fund of knowledge intact, attention and concentration intact, speech fluent and not dysarthric, language intact. Cranial nerves: CN I: not tested CN II: pupils equal, round and reactive to light, visual fields intact, fundi unremarkable, without vessel changes, exudates, hemorrhages or papilledema. CN III, IV, VI:  full range of motion, no nystagmus, no ptosis CN V: facial sensation intact CN VII: upper and lower face symmetric CN VIII: reduced hearing on the right CN IX, X: gag intact, uvula midline CN XI: sternocleidomastoid and trapezius muscles intact CN XII: tongue midline Bulk & Tone: normal, no fasciculations. Motor:  5/5 throughout  Sensation:  Pinprick and vibration sensation intact. Deep Tendon Reflexes:  2+ throughout, toes downgoing.  Finger to nose testing:  Without dysmetria.  Heel to shin:  Without dysmetria.  Gait:  Normal station and stride.  Able to turn and tandem walk. Romberg negative.  IMPRESSION: Cerebrovascular disease Chronic intracranial carotid dissection Cerebral aneurysm.  I don't think there is anything interventional to do at this point.  Aneurysms less than 7 mm in the anterior circulation statistically have 0% risk of rupture over 5 years. Hypertension Right sided sensorineural hearing loss and tinnitus  I don't believe the findings on MRI/MRA are contributing to her hearing loss.  PLAN: 1.  Continue ASA  daily 2.  Her fasting lipid panel from last year looked great.  She will follow up with her PCP next month and have labs repeated.  If it is unchanged, then no change.  LDL goal should be less than 70. 3.  I would repeat MRA of the head to monitor change in aneurysm.  If still stable, then may have follow up imaging every 5 years. 4.  Continue blood pressure control 5.  Follow up in one year.  Thank you for  allowing me to take part in the care of this patient.  Shon Millet, DO  CC:  Juluis Rainier, MD  Hermelinda Medicus, MD

## 2015-04-18 ENCOUNTER — Ambulatory Visit: Payer: Commercial Managed Care - PPO | Admitting: Neurology

## 2015-12-21 ENCOUNTER — Other Ambulatory Visit: Payer: Self-pay | Admitting: Family Medicine

## 2015-12-21 DIAGNOSIS — Z1231 Encounter for screening mammogram for malignant neoplasm of breast: Secondary | ICD-10-CM

## 2016-01-05 ENCOUNTER — Ambulatory Visit
Admission: RE | Admit: 2016-01-05 | Discharge: 2016-01-05 | Disposition: A | Discharge status: Home / Self Care | Payer: Commercial Managed Care - PPO | Source: Ambulatory Visit | Attending: Family Medicine | Admitting: Family Medicine

## 2016-01-05 DIAGNOSIS — Z1231 Encounter for screening mammogram for malignant neoplasm of breast: Secondary | ICD-10-CM

## 2016-08-13 ENCOUNTER — Other Ambulatory Visit: Payer: Self-pay | Admitting: Family Medicine

## 2016-08-13 DIAGNOSIS — E2839 Other primary ovarian failure: Secondary | ICD-10-CM

## 2016-08-15 ENCOUNTER — Other Ambulatory Visit: Payer: Self-pay | Admitting: Family Medicine

## 2016-08-15 DIAGNOSIS — E2839 Other primary ovarian failure: Secondary | ICD-10-CM

## 2016-08-20 ENCOUNTER — Other Ambulatory Visit: Payer: Commercial Managed Care - PPO

## 2016-08-30 ENCOUNTER — Other Ambulatory Visit: Payer: Commercial Managed Care - PPO

## 2016-09-05 ENCOUNTER — Ambulatory Visit
Admission: RE | Admit: 2016-09-05 | Discharge: 2016-09-05 | Disposition: A | Discharge status: Home / Self Care | Payer: Commercial Managed Care - PPO | Source: Ambulatory Visit | Attending: Family Medicine | Admitting: Family Medicine

## 2016-09-05 DIAGNOSIS — E2839 Other primary ovarian failure: Secondary | ICD-10-CM

## 2016-12-05 ENCOUNTER — Other Ambulatory Visit: Payer: Self-pay | Admitting: Family Medicine

## 2016-12-05 DIAGNOSIS — Z1231 Encounter for screening mammogram for malignant neoplasm of breast: Secondary | ICD-10-CM

## 2017-01-07 ENCOUNTER — Ambulatory Visit
Admission: RE | Admit: 2017-01-07 | Discharge: 2017-01-07 | Disposition: A | Discharge status: Home / Self Care | Payer: Commercial Managed Care - PPO | Source: Ambulatory Visit | Attending: Family Medicine | Admitting: Family Medicine

## 2017-01-07 DIAGNOSIS — Z1231 Encounter for screening mammogram for malignant neoplasm of breast: Secondary | ICD-10-CM

## 2017-06-03 ENCOUNTER — Other Ambulatory Visit (HOSPITAL_COMMUNITY): Payer: Self-pay | Admitting: Otolaryngology

## 2017-06-03 DIAGNOSIS — H9190 Unspecified hearing loss, unspecified ear: Secondary | ICD-10-CM

## 2017-06-03 DIAGNOSIS — H539 Unspecified visual disturbance: Secondary | ICD-10-CM

## 2017-06-03 DIAGNOSIS — H9311 Tinnitus, right ear: Secondary | ICD-10-CM

## 2017-06-04 NOTE — Progress Notes (Signed)
Per Dr Everlena CooperJaffe, would like Pt to have CTA head prior to upcoming appt, referred by Dr Haroldine Lawsrossley. [facility], [facility], she is to call Pt and schedule ASAP.

## 2017-06-09 NOTE — Progress Notes (Addendum)
[facility]. She and a coworker have attempted on several occasions to reach Pt, have left messages, Pt has not rtrnd call to schedule. Called Pt at work, provided [facility] with [facility] direct number to schedule [phone].

## 2017-06-11 ENCOUNTER — Ambulatory Visit: Payer: Commercial Managed Care - PPO | Admitting: Neurology

## 2017-06-18 ENCOUNTER — Other Ambulatory Visit (HOSPITAL_COMMUNITY): Payer: Self-pay | Admitting: Otolaryngology

## 2017-06-18 ENCOUNTER — Ambulatory Visit (HOSPITAL_COMMUNITY)
Admission: RE | Admit: 2017-06-18 | Discharge: 2017-06-18 | Disposition: A | Discharge status: Home / Self Care | Payer: Commercial Managed Care - PPO | Source: Ambulatory Visit | Attending: Otolaryngology | Admitting: Otolaryngology

## 2017-06-18 DIAGNOSIS — H9311 Tinnitus, right ear: Secondary | ICD-10-CM

## 2017-06-18 DIAGNOSIS — H9041 Sensorineural hearing loss, unilateral, right ear, with unrestricted hearing on the contralateral side: Secondary | ICD-10-CM | POA: Diagnosis present

## 2017-06-18 DIAGNOSIS — H539 Unspecified visual disturbance: Secondary | ICD-10-CM | POA: Diagnosis present

## 2017-06-18 DIAGNOSIS — H9190 Unspecified hearing loss, unspecified ear: Secondary | ICD-10-CM

## 2017-06-18 DIAGNOSIS — I720 Aneurysm of carotid artery: Secondary | ICD-10-CM | POA: Diagnosis not present

## 2017-06-18 DIAGNOSIS — I6503 Occlusion and stenosis of bilateral vertebral arteries: Secondary | ICD-10-CM | POA: Insufficient documentation

## 2017-06-18 MED ORDER — GADOBENATE DIMEGLUMINE 529 MG/ML IV SOLN
20.0000 mL | Freq: Once | INTRAVENOUS | Status: AC | PRN
  Administered 2017-06-18: 17 mL via INTRAVENOUS

## 2017-06-19 LAB — POCT I-STAT CREATININE: Creatinine, Ser: 0.9 mg/dL (ref 0.44–1.00)

## 2017-06-27 ENCOUNTER — Ambulatory Visit: Payer: Commercial Managed Care - PPO | Admitting: Neurology

## 2017-08-26 ENCOUNTER — Ambulatory Visit: Payer: Commercial Managed Care - PPO | Admitting: Neurology

## 2017-08-26 ENCOUNTER — Encounter

## 2017-10-22 ENCOUNTER — Other Ambulatory Visit: Payer: Self-pay | Admitting: Family Medicine

## 2017-10-22 DIAGNOSIS — R0989 Other specified symptoms and signs involving the circulatory and respiratory systems: Secondary | ICD-10-CM

## 2017-11-06 ENCOUNTER — Other Ambulatory Visit: Payer: Commercial Managed Care - PPO

## 2017-11-13 ENCOUNTER — Ambulatory Visit
Admission: RE | Admit: 2017-11-13 | Discharge: 2017-11-13 | Disposition: A | Discharge status: Home / Self Care | Payer: Commercial Managed Care - PPO | Source: Ambulatory Visit | Attending: Family Medicine | Admitting: Family Medicine

## 2017-11-13 DIAGNOSIS — R0989 Other specified symptoms and signs involving the circulatory and respiratory systems: Secondary | ICD-10-CM

## 2017-11-27 ENCOUNTER — Ambulatory Visit: Payer: Commercial Managed Care - PPO | Admitting: Neurology

## 2017-11-27 ENCOUNTER — Encounter

## 2017-12-10 ENCOUNTER — Ambulatory Visit: Payer: Commercial Managed Care - PPO | Admitting: *Deleted

## 2017-12-26 ENCOUNTER — Ambulatory Visit: Payer: Commercial Managed Care - PPO | Admitting: *Deleted

## 2018-01-13 ENCOUNTER — Ambulatory Visit: Payer: Commercial Managed Care - PPO | Admitting: *Deleted

## 2018-01-29 ENCOUNTER — Other Ambulatory Visit: Payer: Self-pay | Admitting: Family Medicine

## 2018-01-29 DIAGNOSIS — Z1231 Encounter for screening mammogram for malignant neoplasm of breast: Secondary | ICD-10-CM

## 2018-03-06 ENCOUNTER — Ambulatory Visit: Payer: Commercial Managed Care - PPO

## 2018-03-26 ENCOUNTER — Ambulatory Visit: Payer: Commercial Managed Care - PPO

## 2018-04-22 ENCOUNTER — Ambulatory Visit: Payer: Commercial Managed Care - PPO

## 2018-05-13 ENCOUNTER — Ambulatory Visit: Payer: Commercial Managed Care - PPO

## 2018-12-16 ENCOUNTER — Other Ambulatory Visit: Payer: Self-pay | Admitting: Family Medicine

## 2018-12-16 DIAGNOSIS — Z1231 Encounter for screening mammogram for malignant neoplasm of breast: Secondary | ICD-10-CM

## 2019-02-03 ENCOUNTER — Ambulatory Visit
Admission: RE | Admit: 2019-02-03 | Discharge: 2019-02-03 | Disposition: A | Discharge status: Home / Self Care | Payer: Medicare Other | Source: Ambulatory Visit | Attending: Family Medicine | Admitting: Family Medicine

## 2019-02-03 ENCOUNTER — Other Ambulatory Visit: Payer: Self-pay

## 2019-02-03 DIAGNOSIS — Z1231 Encounter for screening mammogram for malignant neoplasm of breast: Secondary | ICD-10-CM

## 2019-03-25 ENCOUNTER — Ambulatory Visit: Payer: Medicare Other

## 2019-04-02 ENCOUNTER — Ambulatory Visit: Payer: Medicare Other | Attending: Internal Medicine

## 2019-04-02 DIAGNOSIS — Z23 Encounter for immunization: Secondary | ICD-10-CM | POA: Insufficient documentation

## 2019-04-02 NOTE — Progress Notes (Signed)
   Covid-19 Vaccination Clinic  Name:  Nicole Best    MRN: [id number] DOB: Feb 17, 1951  04/02/2019  [name] was observed post Covid-19 immunization for 15 minutes without incidence. She was provided with Vaccine Information Sheet and instruction to access the V-Safe system.   [name] was instructed to call 911 with any severe reactions post vaccine: Marland Kitchen Difficulty breathing  . Swelling of your face and throat  . A fast heartbeat  . A bad rash all over your body  . Dizziness and weakness    Immunizations Administered    Name Date Dose VIS Date Route   Pfizer COVID-19 Vaccine 04/02/2019  5:05 PM 0.3 mL 02/05/2019 Intramuscular   Manufacturer: [employer], [employer]   Lot: [id number]   NDC: [address]-1

## 2019-04-15 ENCOUNTER — Ambulatory Visit: Payer: Medicare Other

## 2019-04-27 ENCOUNTER — Ambulatory Visit: Payer: Medicare Other | Attending: Internal Medicine

## 2019-04-27 DIAGNOSIS — Z23 Encounter for immunization: Secondary | ICD-10-CM | POA: Insufficient documentation

## 2019-04-27 NOTE — Progress Notes (Signed)
   Covid-19 Vaccination Clinic  Name:  Nicole Best    MRN: [id number] DOB: January 01, 1951  04/27/2019  Nicole Best was observed post Covid-19 immunization for 30 minutes based on pre-vaccination screening without incident. She was provided with Vaccine Information Sheet and instruction to access the V-Safe system.   Nicole Best was instructed to call 911 with any severe reactions post vaccine: Marland Kitchen Difficulty breathing  . Swelling of face and throat  . A fast heartbeat  . A bad rash all over body  . Dizziness and weakness   Immunizations Administered    Name Date Dose VIS Date Route   Pfizer COVID-19 Vaccine 04/27/2019  4:14 PM 0.3 mL 02/05/2019 Intramuscular   Manufacturer: [employer], [employer]   Lot: [id number]   NDC: [address]-9

## 2019-07-28 ENCOUNTER — Ambulatory Visit: Payer: Medicare Other | Admitting: Registered"

## 2019-08-05 ENCOUNTER — Ambulatory Visit: Payer: Medicare Other

## 2019-09-09 ENCOUNTER — Encounter: Payer: Self-pay | Admitting: Dietician

## 2019-09-09 ENCOUNTER — Encounter: Payer: Medicare Other | Attending: Family Medicine | Admitting: Dietician

## 2019-09-09 ENCOUNTER — Other Ambulatory Visit: Payer: Self-pay

## 2019-09-09 DIAGNOSIS — E119 Type 2 diabetes mellitus without complications: Secondary | ICD-10-CM

## 2019-09-09 NOTE — Progress Notes (Signed)
Patient was seen on 09/09/2019  for the first of a series of three diabetes self-management courses at [facility].  Patient Education Plan per assessed needs and concerns is to attend three course education program for Diabetes Self Management Education.  The following learning objectives were met by the patient during this class:  Describe diabetes, types of diabetes and pathophysiology  State some common risk factors for diabetes  Defines the role of glucose and insulin  Describe the relationship between diabetes and cardiovascular and other risks  State the members of the Healthcare Team  States the rationale for glucose monitoring and when to test  State their individual Target Range  State the importance of logging glucose readings and how to interpret the readings  Identifies A1C target  Explain the correlation between A1c and eAG values  State symptoms and treatment of high blood glucose and low blood glucose  Explain proper technique for glucose testing and identify proper sharps disposal  Handouts given during class include:  How to Thrive:  A Guide for Your Journey with Diabetes by the ADA  Meal Plan Card and carbohydrate content list  Dietary intake form  Low Sodium Flavoring Tips  Types of Fats  Dining Out  Label reading  Snack list  Planning a balanced meal  The diabetes portion plate  Diabetes Resources  A1c to eAG Conversion Chart  Blood Glucose Log  Diabetes Recommended Care Schedule  Support Group  Diabetes Success Plan  Core Class Satisfaction Survey   Follow-Up Plan:  Attend core 2   

## 2019-09-16 ENCOUNTER — Other Ambulatory Visit: Payer: Self-pay

## 2019-09-16 ENCOUNTER — Encounter: Payer: Medicare Other | Admitting: Dietician

## 2019-09-16 DIAGNOSIS — E119 Type 2 diabetes mellitus without complications: Secondary | ICD-10-CM | POA: Diagnosis not present

## 2019-09-19 ENCOUNTER — Encounter: Payer: Self-pay | Admitting: Dietician

## 2019-09-19 NOTE — Progress Notes (Signed)
Patient was seen on 09/16/2019 for the second of a series of three diabetes self-management courses at the [facility]. The following learning objectives were met by the patient during this class:   Describe the role of different macronutrients on glucose  Explain how carbohydrates affect blood glucose  State what foods contain the most carbohydrates  Demonstrate carbohydrate counting  Demonstrate how to read Nutrition Facts food label  Describe effects of various fats on heart health  Describe the importance of good nutrition for health and healthy eating strategies  Describe techniques for managing your shopping, cooking and meal planning  List strategies to follow meal plan when dining out  Describe the effects of alcohol on glucose and how to use it safely  Goals:  Follow Diabetes Meal Plan as instructed  Aim to spread carbs evenly throughout the day  Aim for 3 meals per day and snacks as needed Include lean protein foods to meals/snacks  Monitor glucose levels as instructed by your doctor   Follow-Up Plan:  Attend Core 3  Work towards following your personal food plan.   

## 2019-09-23 ENCOUNTER — Encounter: Payer: Self-pay | Admitting: Dietician

## 2019-09-23 ENCOUNTER — Other Ambulatory Visit: Payer: Self-pay

## 2019-09-23 ENCOUNTER — Encounter: Payer: Medicare Other | Admitting: Dietician

## 2019-09-23 DIAGNOSIS — E119 Type 2 diabetes mellitus without complications: Secondary | ICD-10-CM

## 2019-09-23 NOTE — Progress Notes (Signed)
Patient was seen on 09/23/2019 for the third of a series of three diabetes self-management courses at the [facility].   Janene Madeira the amount of activity recommended for healthy living . Describe activities suitable for individual needs . Identify ways to regularly incorporate activity into daily life . Identify barriers to activity and ways to over come these barriers  Identify diabetes medications being personally used and their primary action for lowering glucose and possible side effects . Describe role of stress on blood glucose and develop strategies to address psychosocial issues . Identify diabetes complications and ways to prevent them  Explain how to manage diabetes during illness . Evaluate success in meeting personal goal . Establish 2-3 goals that they will plan to diligently work on  Goals:   I will count my carb choices at most meals and snacks  I will be more active  Your patient has identified these potential barriers to change:  Motivation  Your patient has identified their diabetes self-care support plan as  On-line Resources   Plan:  Attend Support Group as desired

## 2020-01-25 ENCOUNTER — Other Ambulatory Visit: Payer: Self-pay | Admitting: Family Medicine

## 2020-01-25 DIAGNOSIS — Z1231 Encounter for screening mammogram for malignant neoplasm of breast: Secondary | ICD-10-CM

## 2020-02-09 ENCOUNTER — Other Ambulatory Visit: Payer: Self-pay

## 2020-02-09 ENCOUNTER — Ambulatory Visit
Admission: RE | Admit: 2020-02-09 | Discharge: 2020-02-09 | Disposition: A | Discharge status: Home / Self Care | Payer: Medicare Other | Source: Ambulatory Visit

## 2020-02-09 DIAGNOSIS — Z1231 Encounter for screening mammogram for malignant neoplasm of breast: Secondary | ICD-10-CM

## 2021-01-30 ENCOUNTER — Other Ambulatory Visit: Payer: Self-pay | Admitting: Family Medicine

## 2021-01-30 DIAGNOSIS — Z1231 Encounter for screening mammogram for malignant neoplasm of breast: Secondary | ICD-10-CM

## 2021-03-07 ENCOUNTER — Ambulatory Visit: Payer: Medicare Other

## 2021-03-19 ENCOUNTER — Other Ambulatory Visit: Payer: Self-pay

## 2021-03-30 ENCOUNTER — Encounter: Payer: Self-pay | Admitting: Cardiology

## 2021-03-30 ENCOUNTER — Ambulatory Visit: Payer: Medicare Other | Admitting: Cardiology

## 2021-03-30 ENCOUNTER — Other Ambulatory Visit: Payer: Self-pay

## 2021-03-30 VITALS — BP 154/101 | HR 96 | Temp 98.0°F | Resp 16 | Ht 70.0 in | Wt 177.0 lb

## 2021-03-30 DIAGNOSIS — R55 Syncope and collapse: Secondary | ICD-10-CM

## 2021-03-30 DIAGNOSIS — I10 Essential (primary) hypertension: Secondary | ICD-10-CM

## 2021-03-30 DIAGNOSIS — I6503 Occlusion and stenosis of bilateral vertebral arteries: Secondary | ICD-10-CM

## 2021-03-30 DIAGNOSIS — I773 Arterial fibromuscular dysplasia: Secondary | ICD-10-CM

## 2021-03-30 DIAGNOSIS — I701 Atherosclerosis of renal artery: Secondary | ICD-10-CM

## 2021-03-30 NOTE — Progress Notes (Signed)
Primary Physician/Referring:  Leighton Ruff, MD (Inactive)  Patient ID: Nicole Best, female    DOB: 1950-08-05, 71 y.o.   MRN: [id number]  Chief Complaint  Patient presents with   Hypertension   New Patient (Initial Visit)   Dizziness   HPI:    Nicole Best  is a 71 y.o. 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  Presented with 1 episode of syncope and was seen at [facility] on 03/14/2021, syncope felt to be due to relative hypotension and dehydration along with worsening renal function.  She received IV hydration and discharged home.  Patient has had no recurrence of any dizziness or syncope, but states that blood pressure is still not been well controlled.  She denies any chest pain or shortness of breath.  Accompanied by her daughter.  Past Medical History:  Diagnosis Date   Diabetes mellitus without complication ([place])    Hypertension    Renal disorder    Past Surgical History:  Procedure Laterality Date   COLONOSCOPY     RENAL ARTERY ANGIOPLASTY     VAGINAL HYSTERECTOMY     Family History  Problem Relation Age of Onset   Hypertension Mother    Hypertension Father     Social History   Tobacco Use   Smoking status: Former    Packs/day: 0.50    Years: 30.00    Pack years: 15.00    Types: Cigarettes    Quit date: 02/28/2014    Years since quitting: 7.0   Smokeless tobacco: Never  Substance Use Topics   Alcohol use: No    Alcohol/week: 0.0 standard drinks   Marital Status: Widowed  ROS  Review  of Systems  Cardiovascular:  Negative for chest pain, dyspnea on exertion and leg swelling.  Objective  Blood pressure (!) 154/101, pulse 96, temperature 98 F (36.7 C), resp. rate 16, height 5' 10"  (1.778 m), weight 177 lb (80.3 kg), SpO2 97 %. Body mass index is 25.4 kg/m.  Vitals with BMI 03/30/2021 09/09/2019 04/05/2015  Height 5' 10"  5' 10"  5' 7.5"  Weight 177 lbs 169 lbs 169 lbs  BMI 25.4 22.48 25.0  Systolic 037 - 048  Diastolic 889 - 64  Pulse 96 - 86    Orthostatic VS for the past 72 hrs (Last 3 readings):  Orthostatic BP Patient Position BP Location Cuff Size Orthostatic Pulse  03/30/21 1325 (!) 145/97 Standing Left Arm Normal 109  03/30/21 1324 (!) 147/92 Sitting Left Arm Normal 102  03/30/21 1323 (!) 178/107 Supine Left Arm Normal 96     Physical Exam Neck:     Vascular: No carotid bruit or JVD.  Cardiovascular:     Rate and Rhythm: Normal rate and regular rhythm.     Pulses: Intact distal pulses.     Heart sounds: Normal heart sounds. No murmur heard.   No gallop.  Pulmonary:     Effort: Pulmonary effort is normal.     Breath sounds: Normal breath sounds.  Abdominal:     General: Bowel sounds are normal.     Palpations:  Abdomen is soft.  Musculoskeletal:     Right lower leg: No edema.     Left lower leg: No edema.     Medications and allergies  No Known Allergies   Medication list after today's encounter   Current Outpatient Medications  Medication Instructions   amLODipine (NORVASC) 10 mg, Oral, Daily   aspirin 325 mg, Oral, See admin instructions, Only take twice a week no specific days    atorvastatin (LIPITOR) 10 mg, Oral, Daily   Blood Pressure Monitoring (B-D ASSURE BPM/DELUXE ARM CUFF) MISC 1 Units, Does not apply, As needed   cloNIDine (CATAPRES) 0.3 mg, Oral, 2 times daily   hydrALAZINE (APRESOLINE) 25 mg, Oral, Every 8 hours   labetalol (NORMODYNE) 100 mg, Oral, 2 times daily   losartan (COZAAR) 100 mg, Oral, Daily   metFORMIN (GLUCOPHAGE) 500  MG tablet Oral, 2 times daily with meals   Laboratory examination:   External labs:   Labs 03/14/2021:  hs troponin I <34 NG per liter  Sodium 135, potassium 3.5, BUN 42, creatinine 1.42, EGFR 40 mL, LFTs normal.  Hb 13.0/HCT 39.9, platelets 433.  Cholesterol, total 78.000 mg 01/04/2021 HDL 21.000 mg 01/04/2021 LDL 38.000 mg 01/04/2021 Triglycerides 101.000 m 01/04/2021  A1C 9.300 % 01/04/2021  Creatinine, Serum 0.820 mg/ 01/04/2021 Potassium 4.700 mm 01/04/2021 ALT (SGPT) 28.000 U/L 01/04/2021   Radiology:   MR angio of the head 06/18/2017: 1. No acute infarct. Progressive chronic ischemic change in the right cerebellum and pons has developed since 2016. No intracranial mass lesion. 2. 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 Moderate stenosis distal left vertebral artery also unchanged. 3. Chronic dissecting aneurysm of the left internal carotid artery through the skull base unchanged from prior studies. Question fibromuscular dysplasia 4. 3 mm aneurysm right cavernous carotid unchanged from 02/13/2015.  Cardiac Studies:   Renal artery duplex 03/01/2014: Right Renal Aortic Ratio: 2.18  Left Renal Aortic Ratio: 2.72  Ratios < 3.5 Suggestive of 1-59% stenosis. - Bilateral normal intrarenal resistive indices.   - Abdominal aorta diameters less than 3 cm. No dilatations or    aortic stenosis seen.    Echocardiogram 03/01/2014: - Left ventricle: The cavity size was normal. Wall thickness was    increased in a pattern of severe LVH. Systolic function was    vigorous. The estimated ejection fraction was in the range of 65%    to 70%. Wall motion was normal; there were no regional wall    motion abnormalities. Doppler parameters are consistent with    abnormal left ventricular relaxation (grade 1 diastolic    dysfunction). The E/e&' ratio is between 8-15, suggesting    indeterminate LV filling pressure.  - Left atrium: The atrium was normal in size.    Lower extremity ultrasound with segmental pressures 11/13/2017: No evidence of hemodynamically significant lower extremity arterial occlusive disease at rest.  EKG:   EKG 03/30/2021: Normal sinus rhythm at rate of 95 bpm, biatrial enlargement, leftward axis.  Incomplete right bundle branch block.  Poor R wave progression, cannot exclude anteroseptal infarct old.  LVH with repolarization normality.    Assessment     ICD-10-CM   1. Malignant hypertension  I10 EKG 12-Lead    PCV ECHOCARDIOGRAM COMPLETE    MR ABDOMEN WO CONTRAST    Basic metabolic panel    Aldosterone + renin activity w/ ratio    2. Syncope and collapse  R55 PCV CAROTID DUPLEX (BILATERAL)    PCV MYOCARDIAL PERFUSION WITH LEXISCAN  CT ANGIO HEAD NECK W WO CM W PERF (CODE STROKE)    3. Arterial fibromuscular dysplasia (HCC) Left carotid and possible bilateral renals  I77.3 MR ABDOMEN WO CONTRAST    4. Renal artery stenosis (HCC)  I70.1 MR ABDOMEN WO CONTRAST    5. Vertebral artery stenosis, asymptomatic, bilateral  I65.03 CT ANGIO HEAD NECK W WO CM W PERF (CODE STROKE)       Medications Discontinued During This Encounter  Medication Reason   fluticasone (FLONASE) 50 MCG/ACT nasal spray    valsartan (DIOVAN) 160 MG tablet     No orders of the defined types were placed in this encounter.  Orders Placed This Encounter  Procedures   MR ABDOMEN WO CONTRAST    Standing Status:   Future    Standing Expiration Date:   05/28/2021    Order Specific Question:   What is the patient's sedation requirement?    Answer:   No Sedation    Order Specific Question:   Does the patient have a pacemaker or implanted devices?    Answer:   No    Order Specific Question:   Preferred imaging location?    Answer:   [facility] (table limit-550lbs)    Order Specific Question:   Radiology Contrast Protocol - do NOT remove file path    Answer:   [email or web address]   CT ANGIO HEAD NECK W WO  CM W PERF (CODE STROKE)    Standing Status:   Future    Standing Expiration Date:   05/28/2021    Order Specific Question:   If indicated for the ordered procedure, I authorize the administration of contrast media per Radiology protocol    Answer:   Yes    Order Specific Question:   Preferred imaging location?    Answer:   [facility]   Basic metabolic panel   Aldosterone + renin activity w/ ratio   PCV MYOCARDIAL PERFUSION WITH LEXISCAN    Standing Status:   Future    Standing Expiration Date:   05/28/2021   EKG 12-Lead   PCV ECHOCARDIOGRAM COMPLETE    Standing Status:   Future    Standing Expiration Date:   03/30/2022   Recommendations:   Nicole Best is a 71 y.o.  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 Presented with 1 episode of syncope and was seen at [facility] on 03/14/2021, 5 to be due to relative hypotension and dehydration along with worsening renal function.  She received IV hydration and discharged home.  Her serum creatinine had increased in the ED.  Extremely complex patient with multiple vascular issues, continues to have resistant hypertension in spite of multiple medications.  I reviewed all her data from previous evaluations, I do suspect she probably has FMD or even atherosclerotic involving bilateral renal arteries as the duplex in 2019 had revealed <60% stenosis bilaterally.  She has ongoing significant risk factors including uncontrolled diabetes.  In view of bilateral occlusion of the vertebral arteries  noted previously, chronic left carotid dissection, right cavernous aneurysm, I would like to repeat  imaging studies including CTA of the head and neck, also performed MRI of the abdomen without contrast to look for renal artery stenosis and have a low threshold to perform renal arteriogram.  We will repeat BMP and at the same time will obtain aldosterone to renin activity ratio.  Although asymptomatic, she has significant atherosclerotic risks as noted above and needs cardiac risk stratification including an echocardiogram and a nuclear stress test.  In view of uncontrolled hypertension, known cerebral aneurysm, will perform Lexiscan nuclear stress test.  Blood pressure is uncontrolled but this is chronic.  I did not make any changes to her medications but advised her to continue to monitor her blood pressure at home and I have recommended that she space the medications instead of taking all the medications at 1 time and see how she does.  I would like to see her back after the above test and I will then make recommendations.  Goal blood pressure control will be <130/80 mmHg.  However in view of vertebral artery stenosis, chronic hypertension, not sure whether she will tolerate aggressive control of blood pressure.  I would also forward my office visit note to Dr. Metta Clines as he was following her in the past but appears to have dropped off.   Adrian Prows, MD, [facility] 03/30/2021, 4:09 PM Office: [phone]

## 2021-05-01 ENCOUNTER — Inpatient Hospital Stay
Admission: RE | Admit: 2021-05-01 | Discharge: 2021-05-01 | Disposition: A | Discharge status: Home / Self Care | Payer: Medicare Other | Source: Ambulatory Visit | Attending: Cardiology | Admitting: Cardiology

## 2021-05-02 ENCOUNTER — Other Ambulatory Visit: Payer: Medicare Other

## 2021-05-02 ENCOUNTER — Ambulatory Visit: Payer: Medicare Other

## 2021-05-02 ENCOUNTER — Other Ambulatory Visit: Payer: Self-pay

## 2021-05-02 DIAGNOSIS — I10 Essential (primary) hypertension: Secondary | ICD-10-CM

## 2021-05-02 DIAGNOSIS — R55 Syncope and collapse: Secondary | ICD-10-CM

## 2021-05-08 ENCOUNTER — Other Ambulatory Visit: Payer: Medicare Other

## 2021-05-11 ENCOUNTER — Ambulatory Visit: Payer: Medicare Other | Admitting: Cardiology

## 2021-05-14 ENCOUNTER — Ambulatory Visit
Admission: RE | Admit: 2021-05-14 | Discharge: 2021-05-14 | Disposition: A | Discharge status: Home / Self Care | Payer: Medicare Other | Source: Ambulatory Visit | Attending: Cardiology | Admitting: Cardiology

## 2021-05-14 ENCOUNTER — Other Ambulatory Visit: Payer: Self-pay

## 2021-05-14 DIAGNOSIS — I6503 Occlusion and stenosis of bilateral vertebral arteries: Secondary | ICD-10-CM

## 2021-05-14 DIAGNOSIS — R55 Syncope and collapse: Secondary | ICD-10-CM

## 2021-05-14 MED ORDER — IOPAMIDOL (ISOVUE-370) INJECTION 76%
150.0000 mL | Freq: Once | INTRAVENOUS | Status: AC | PRN
Start: 2021-05-14 — End: 2021-05-14
  Administered 2021-05-14: 150 mL via INTRAVENOUS

## 2021-05-15 NOTE — Progress Notes (Signed)
CT angiogram of the head and neck 05/14/2021: ?1. Unchanged compared to 02/28/2014 appearance of partially thrombosed fusiform aneurysm of the distal petrous segment of the left internal carotid artery. ?2. Unchanged inferiorly projecting 3 mm aneurysm of the cavernous right internal carotid artery. ?3. Poor visualization of the vertebral arteries due to high contrast concentration within venous collaterals in the posterior soft ?tissues of the neck. Left vertebral artery appears to be normal but I cannot confirm patency of the right vertebral artery.

## 2021-05-17 ENCOUNTER — Other Ambulatory Visit: Payer: Self-pay

## 2021-05-17 ENCOUNTER — Ambulatory Visit
Admission: RE | Admit: 2021-05-17 | Discharge: 2021-05-17 | Disposition: A | Discharge status: Home / Self Care | Payer: Medicare Other | Source: Ambulatory Visit | Attending: Cardiology | Admitting: Cardiology

## 2021-05-17 DIAGNOSIS — I10 Essential (primary) hypertension: Secondary | ICD-10-CM

## 2021-05-17 DIAGNOSIS — I773 Arterial fibromuscular dysplasia: Secondary | ICD-10-CM

## 2021-05-17 DIAGNOSIS — I701 Atherosclerosis of renal artery: Secondary | ICD-10-CM

## 2021-05-21 NOTE — Progress Notes (Signed)
MRI of the abdomen 05/21/2021: ?1.  Atherosclerotic plaque with normal caliber abdominal aorta. ?2. Nondiagnostic evaluation for bilateral renal arteries secondary to lack of intravenous contrast or acquisition technique, consider contrast-enhanced renal MRI or CTA of the abdomen and pelvis. ?3. 1.1 cm intense pancreatic lesion incompletely characterized without associated downstream pancreatic atrophy or ductal dilatation.  Favor benign pancreatic sidebranch IPMN. ?4. Cardiomegaly, evaluate further by cardiac echo. ?5. Cholelithiasis without evidence of cholecystitis.

## 2021-06-11 ENCOUNTER — Ambulatory Visit: Payer: Medicare Other | Admitting: Cardiology

## 2021-06-11 ENCOUNTER — Encounter: Payer: Self-pay | Admitting: Cardiology

## 2021-06-11 VITALS — BP 136/82 | HR 79 | Temp 96.3°F | Resp 17 | Ht 70.0 in | Wt 174.2 lb

## 2021-06-11 DIAGNOSIS — E7800 Pure hypercholesterolemia, unspecified: Secondary | ICD-10-CM

## 2021-06-11 DIAGNOSIS — I10 Essential (primary) hypertension: Secondary | ICD-10-CM

## 2021-06-11 DIAGNOSIS — R55 Syncope and collapse: Secondary | ICD-10-CM

## 2021-06-11 DIAGNOSIS — I700 Atherosclerosis of aorta: Secondary | ICD-10-CM

## 2021-06-11 NOTE — Progress Notes (Signed)
? ?Primary Physician/Referring:  Marda Stalker, PA-C ? ?Patient ID: Nicole Best, female    DOB: August 21, 1950, 71 y.o.   MRN: [id number] ? ?Chief Complaint  ?Patient presents with  ? Hypertension  ?  4 WEEKS  ? VERTEBRAL OCCULSION  ? ?HPI:   ? ?Nicole Best  is a 71 y.o. 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  Patient now presents for 6-week office visit, underwent echocardiogram and nuclear stress test and CT angiogram of the head and neck.   ? ?Patient states that with aggressive changes in her diet and continued exercise and taking medications appropriately, changing the medication regimen with regard to timing of the medication, she has noticed marked improvement in blood pressure.  She remains asymptomatic she is very pleased of how she feels since her last office visit since making changes in her medications with regard to timing of the medications. ? ?Past Medical History:  ?Diagnosis Date  ? Diabetes mellitus without complication ([place])   ? Hypertension   ? Renal disorder   ? ?Past Surgical History:  ?Procedure Laterality Date  ? COLONOSCOPY    ? RENAL ARTERY ANGIOPLASTY    ? VAGINAL HYSTERECTOMY    ? ?Family History  ?Problem Relation Age of Onset  ? Hypertension Mother   ? Hypertension Father   ?  ?Social History  ? ?Tobacco Use  ? Smoking status: Former  ?  Packs/day: 0.50  ?  Years: 30.00  ?  Pack years: 15.00  ?  Types: Cigarettes  ?  Quit date: 02/28/2014  ?  Years since quitting: 7.2  ? Smokeless tobacco: Never  ?Substance Use Topics  ? Alcohol use:  No  ?  Alcohol/week: 0.0 standard drinks  ? ?Marital Status: Widowed  ?ROS  ?Review of Systems  ?Cardiovascular:  Negative for chest pain, dyspnea on exertion and leg swelling.  ?Objective  ?Blood pressure 136/82, pulse 79, temperature (!) 96.3 ?F (35.7 ?C), temperature source Temporal, resp. rate 17, height 5' 10"  (1.778 m), weight 174 lb 3.2 oz (79 kg), SpO2 99 %. Body mass index is 25 kg/m?.  ? ?  06/11/2021  ?  1:40 PM 03/30/2021  ?  1:14 PM 09/09/2019  ?  6:42 PM  ?Vitals with BMI  ?Height 5' 10"  5' 10"  5' 10"   ?Weight 174 lbs 3 oz 177 lbs 169 lbs  ?BMI 25 25.4 24.25  ?Systolic 564 332   ?Diastolic 82 951   ?Pulse 79 96   ?  ?Orthostatic VS for the past 72 hrs (Last 3 readings): ? Patient Position BP Location Cuff Size  ?06/11/21 1340 Sitting Left Arm Normal  ?  ? Physical Exam ?Neck:  ?   Vascular: No carotid bruit or JVD.  ?Cardiovascular:  ?   Rate and Rhythm: Normal rate and regular rhythm.  ?   Pulses: Intact distal pulses.  ?   Heart sounds: Normal heart sounds. No murmur heard. ?  No gallop.  ?Pulmonary:  ?   Effort: Pulmonary effort is normal.  ?   Breath sounds: Normal breath sounds.  ?Abdominal:  ?   General: Bowel sounds  are normal.  ?   Palpations: Abdomen is soft.  ?Musculoskeletal:  ?   Right lower leg: No edema.  ?   Left lower leg: No edema.  ?  ? ?Medications and allergies  ?No Known Allergies  ? ?Medication list after today's encounter  ? ?Current Outpatient Medications:  ?  aspirin 325 MG tablet, Take 325 mg by mouth See admin instructions. Only take twice a week no specific days, Disp: , Rfl:  ?  atorvastatin (LIPITOR) 10 MG tablet, Take 10 mg by mouth daily., Disp: , Rfl:  ?  Blood Pressure Monitoring (B-D ASSURE BPM/DELUXE ARM CUFF) MISC, 1 Units by Does not apply route as needed., Disp: 1 each, Rfl: 0 ?  cloNIDine (CATAPRES) 0.3 MG tablet, Take 0.3 mg by mouth 2 (two) times daily., Disp: , Rfl:  ?  ergocalciferol (VITAMIN D2) 1.25 MG (50000 UT) capsule, Take 1 capsule by mouth once a week.,  Disp: , Rfl:  ?  hydrALAZINE (APRESOLINE) 25 MG tablet, Take 1 tablet (25 mg total) by mouth every 8 (eight) hours., Disp: 90 tablet, Rfl: 0 ?  losartan (COZAAR) 100 MG tablet, Take 100 mg by mouth every morning., Disp: , Rfl:  ?  metFORMIN (GLUCOPHAGE) 500 MG tablet, Take by mouth 2 (two) times daily with a meal., Disp: , Rfl:  ?  amLODipine (NORVASC) 10 MG tablet, Take 1 tablet (10 mg total) by mouth every evening., Disp: 30 tablet, Rfl: 0 ?  labetalol (NORMODYNE) 100 MG tablet, Take 1 tablet (100 mg total) by mouth 2 (two) times daily. Afternoon and night, Disp: 60 tablet, Rfl: 0  ? ?Laboratory examination:  ? ?External labs:  ? ?Labs 03/14/2021: ? ?hs troponin I <34 NG per liter ? ?Sodium 135, potassium 3.5, BUN 42, creatinine 1.42, EGFR 40 mL, LFTs normal. ? ?Hb 13.0/HCT 39.9, platelets 433. ? ?Cholesterol, total 78.000 mg 01/04/2021 ?HDL 21.000 mg 01/04/2021 ?LDL 38.000 mg 01/04/2021 ?Triglycerides 101.000 m 01/04/2021 ? ?A1C 9.300 % 01/04/2021 ? ?Creatinine, Serum 0.820 mg/ 01/04/2021 ?Potassium 4.700 mm 01/04/2021 ?ALT (SGPT) 28.000 U/L 01/04/2021 ? ? ?Radiology:  ? ?MR angio of the head 06/18/2017: ?1. No acute infarct. Progressive chronic ischemic change in the ?right cerebellum and pons has developed since 2016. No intracranial mass lesion. ?2. 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 ?Moderate stenosis distal left vertebral artery also unchanged. ?3. Chronic dissecting aneurysm of the left internal carotid artery ?through the skull base unchanged from prior studies. Question fibromuscular dysplasia ?4. 3 mm aneurysm right cavernous carotid unchanged from 02/13/2015. ? ?CT angiogram of the head and neck 05/14/2021: ?1. Unchanged compared to 02/28/2014 appearance of partially thrombosed fusiform aneurysm of the distal petrous segment of the left internal carotid artery (No suggestion fo ?FMD noted by MR angio head 06/18/2017). ?2. Unchanged inferiorly projecting 3 mm aneurysm of the cavernous  right internal carotid artery. ?3. Poor visualization of the vertebral arteries due to high contrast concentration within venous collaterals in the posterior soft ?tissues of the neck. Left vertebral artery appears to be normal but I cannot confirm patency of the right vertebral artery (Right VA was occluded distally by MR angio head 06/18/2017).  ? ?MRI of the abdomen 05/21/2021: ?1.  Atherosclerotic plaque with normal caliber abdominal aorta. ?2. Nondiagnostic evaluation for bilateral renal arteries secondary to lack of intravenous contrast or acquisition technique, consider contrast-enhanced renal MRI or CTA of the abdomen and pelvis. ?3. 1.1 cm intense pancreatic lesion incompletely characterized without associated downstream pancreatic atrophy or ductal dilatation.  Favor benign pancreatic sidebranch IPMN. ?4. Cardiomegaly, evaluate further by cardiac echo. ?5. Cholelithiasis without evidence of cholecystitis. ? ?Cardiac Studies:  ? ?Renal artery duplex March 18, 2014: ?Right Renal Aortic Ratio: 2.18  ?Left Renal Aortic Ratio: 2.72  ?Ratios < 3.5 Suggestive of 1-59% stenosis. ?- Bilateral normal intrarenal resistive indices.   ?- Abdominal aorta diameters less than 3 cm. No dilatations or  ?  aortic stenosis seen.    ? ?Lower extremity ultrasound with segmental pressures 11/13/2017: ?No evidence of hemodynamically significant lower extremity arterial ?occlusive disease at rest. ? ?PCV ECHOCARDIOGRAM COMPLETE 05/02/2021  ?Hyperdynamic LV systolic function with visual EF >70%. Left ventricle cavity is normal in size. Mild left ventricular hypertrophy. Normal global wall motion. Normal diastolic filling pattern, normal LAP. ?Right ventricle cavity is normal in size. Mild hypertrophy of the right ventricle. Normal right ventricular function. ?Mild tricuspid regurgitation. No evidence of pulmonary hypertension. ?Compared to study 2014-03-18 G1DD and LV filling pressures are now normal otherwise no significant change. ?   ?PCV  MYOCARDIAL PERFUSION WITH LEXISCAN 05/02/2021 . ?1 Day Rest/Stress Protocol. ?Stress EKG is non-diagnostic for ischemia as it's a pharmacologic stress test using Lexiscan. ?Normal myocardial perfusion,

## 2021-08-10 ENCOUNTER — Ambulatory Visit: Payer: Medicare Other

## 2021-08-13 ENCOUNTER — Ambulatory Visit: Payer: Medicare Other

## 2021-11-21 DIAGNOSIS — E559 Vitamin D deficiency, unspecified: Secondary | ICD-10-CM | POA: Diagnosis not present

## 2021-11-21 DIAGNOSIS — I10 Essential (primary) hypertension: Secondary | ICD-10-CM | POA: Diagnosis not present

## 2021-11-21 DIAGNOSIS — E1165 Type 2 diabetes mellitus with hyperglycemia: Secondary | ICD-10-CM | POA: Diagnosis not present

## 2021-11-21 DIAGNOSIS — E7800 Pure hypercholesterolemia, unspecified: Secondary | ICD-10-CM | POA: Diagnosis not present

## 2021-11-21 DIAGNOSIS — E1121 Type 2 diabetes mellitus with diabetic nephropathy: Secondary | ICD-10-CM | POA: Diagnosis not present

## 2021-11-21 DIAGNOSIS — M8588 Other specified disorders of bone density and structure, other site: Secondary | ICD-10-CM | POA: Diagnosis not present

## 2021-11-22 ENCOUNTER — Other Ambulatory Visit: Payer: Self-pay | Admitting: Family Medicine

## 2021-11-22 DIAGNOSIS — M8580 Other specified disorders of bone density and structure, unspecified site: Secondary | ICD-10-CM

## 2021-12-10 ENCOUNTER — Ambulatory Visit: Payer: Medicare Other | Admitting: Cardiology

## 2021-12-10 ENCOUNTER — Encounter: Payer: Self-pay | Admitting: Cardiology

## 2021-12-10 VITALS — BP 125/78 | HR 74 | Temp 97.9°F | Resp 16 | Ht 70.0 in | Wt 175.8 lb

## 2021-12-10 DIAGNOSIS — I10 Essential (primary) hypertension: Secondary | ICD-10-CM

## 2021-12-10 DIAGNOSIS — E7800 Pure hypercholesterolemia, unspecified: Secondary | ICD-10-CM | POA: Diagnosis not present

## 2021-12-10 DIAGNOSIS — I773 Arterial fibromuscular dysplasia: Secondary | ICD-10-CM

## 2021-12-10 NOTE — Progress Notes (Signed)
Primary Physician/Referring:  Marda Stalker, PA-C  Patient ID: Nicole Best, female    DOB: May 11, 1950, 71 y.o.   MRN: [id number]  Chief Complaint  Patient presents with   Renal artery stenosis   Carotid stenosis   Hypertension   Hyperlipidemia   Follow-up    6 months   HPI:    Nicole Best  is a 71 y.o. 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  She is presently asymptomatic.    Past Medical History:  Diagnosis Date   Diabetes mellitus without complication ([place])    Hypertension    Renal disorder    Past Surgical History:  Procedure Laterality Date   COLONOSCOPY     RENAL ARTERY ANGIOPLASTY     VAGINAL HYSTERECTOMY     Family History  Problem Relation Age of Onset   Hypertension Mother    Hypertension Father     Social History   Tobacco Use   Smoking status: Former    Packs/day: 0.50    Years: 30.00    Total pack years: 15.00    Types: Cigarettes    Quit date: 02/28/2014    Years since quitting: 7.7   Smokeless tobacco: Never  Substance Use Topics   Alcohol use: No    Alcohol/week: 0.0 standard drinks of alcohol   Marital Status: Widowed  ROS  Review of Systems  Cardiovascular:  Negative for chest pain, dyspnea on exertion and leg swelling.   Objective  Blood pressure 125/78, pulse 74, temperature 97.9 F (36.6 C), temperature source Temporal, resp. rate 16, height 5\' 10"  (1.778 m), weight 175 lb 12.8 oz (79.7 kg), SpO2 97 %. Body mass index is 25.22 kg/m.     12/10/2021   10:57 AM 06/11/2021    1:40 PM 03/30/2021    1:14 PM   Vitals with BMI  Height 5\' 10"  5\' 10"  5\' 10"   Weight 175 lbs 13 oz 174 lbs 3 oz 177 lbs  BMI 0000000 25 123456  Systolic 0000000 [id number]  Diastolic 78 82 [id number]  Pulse 74 79 96    Orthostatic VS for the past 72 hrs (Last 3 readings):  Patient Position BP Location Cuff Size  12/10/21 1057 Sitting Left Arm Normal     Physical Exam Neck:     Vascular: No carotid bruit or JVD.  Cardiovascular:     Rate and Rhythm: Normal rate and regular rhythm.     Pulses: Intact distal pulses.     Heart sounds: Normal heart sounds. No murmur heard.    No gallop.  Pulmonary:     Effort: Pulmonary effort is normal.     Breath sounds: Normal breath sounds.  Abdominal:     General: Bowel sounds are normal.     Palpations: Abdomen is soft.  Musculoskeletal:     Right lower leg: No edema.     Left lower leg: No edema.     Medications and allergies  No Known Allergies   Medication list after today's encounter   Current Outpatient Medications:    amLODipine (NORVASC) 10 MG tablet, Take 1 tablet (10 mg total) by mouth  every evening., Disp: 30 tablet, Rfl: 0   aspirin 325 MG tablet, Take 325 mg by mouth See admin instructions. Only take twice a week no specific days, Disp: , Rfl:    atorvastatin (LIPITOR) 10 MG tablet, Take 10 mg by mouth daily., Disp: , Rfl:    Blood Pressure Monitoring (B-D ASSURE BPM/DELUXE ARM CUFF) MISC, 1 Units by Does not apply route as needed., Disp: 1 each, Rfl: 0   cloNIDine (CATAPRES) 0.3 MG tablet, Take 0.3 mg by mouth 2 (two) times daily., Disp: , Rfl:    hydrALAZINE (APRESOLINE) 25 MG tablet, Take 1 tablet (25 mg total) by mouth every 8 (eight) hours. (Patient taking differently: Take 25 mg by mouth daily.), Disp: 90 tablet, Rfl: 0   labetalol (NORMODYNE) 100 MG tablet, Take 1 tablet (100 mg total) by mouth 2 (two) times daily. Afternoon and night, Disp: 60 tablet, Rfl: 0   losartan (COZAAR) 100 MG tablet, Take 100 mg by mouth every morning., Disp: , Rfl:    metFORMIN  (GLUCOPHAGE) 500 MG tablet, Take 1,000 mg by mouth 2 (two) times daily with a meal., Disp: , Rfl:    Laboratory examination:   External labs:   Cholesterol, total 89.000 mg 11/21/2021 HDL 25.000 mg 11/21/2021 LDL 44.000 mg 11/21/2021 Triglycerides 108.000 m 11/21/2021  A1C 6.500 mg/ 11/21/2021  Creatinine, Serum 0.890 mg/ 11/21/2021 Potassium 4.500 mm 11/21/2021 ALT (SGPT) 26.000 U/L 11/21/2021  Radiology:   MR angio of the head 06/18/2017: 1. No acute infarct. Progressive chronic ischemic change in the right cerebellum and pons has developed since 2016. No intracranial mass lesion. 2. 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 Moderate stenosis distal left vertebral artery also unchanged. 3. Chronic dissecting aneurysm of the left internal carotid artery through the skull base unchanged from prior studies. Question fibromuscular dysplasia 4. 3 mm aneurysm right cavernous carotid unchanged from 02/13/2015.  CT angiogram of the head and neck 05/14/2021: 1. Unchanged compared to 02/28/2014 appearance of partially thrombosed fusiform aneurysm of the distal petrous segment of the left internal carotid artery (No suggestion fo ?FMD noted by MR angio head 06/18/2017). 2. Unchanged inferiorly projecting 3 mm aneurysm of the cavernous right internal carotid artery. 3. Poor visualization of the vertebral arteries due to high contrast concentration within venous collaterals in the posterior soft tissues of the neck. Left vertebral artery appears to be normal but I cannot confirm patency of the right vertebral artery (Right VA was occluded distally by MR angio head 06/18/2017).   MRI of the abdomen 05/21/2021: 1.  Atherosclerotic plaque with normal caliber abdominal aorta. 2. Nondiagnostic evaluation for bilateral renal arteries secondary to lack of intravenous contrast or acquisition technique, consider contrast-enhanced renal MRI or CTA of the abdomen and pelvis. 3. 1.1 cm intense  pancreatic lesion incompletely characterized without associated downstream pancreatic atrophy or ductal dilatation.  Favor benign pancreatic sidebranch IPMN. 4. Cardiomegaly, evaluate further by cardiac echo. 5. Cholelithiasis without evidence of cholecystitis.  Cardiac Studies:   Renal artery duplex 03/01/2014: Right Renal Aortic Ratio: 2.18  Left Renal Aortic Ratio: 2.72  Ratios < 3.5 Suggestive of 1-59% stenosis. - Bilateral normal intrarenal resistive indices.   - Abdominal aorta diameters less than 3 cm. No dilatations or    aortic stenosis seen.     Lower extremity ultrasound with segmental pressures 11/13/2017: No evidence of hemodynamically significant lower extremity arterial occlusive disease at rest.  PCV ECHOCARDIOGRAM COMPLETE 05/02/2021  Hyperdynamic LV systolic function with visual EF >70%. Left ventricle cavity  is normal in size. Mild left ventricular hypertrophy. Normal global wall motion. Normal diastolic filling pattern, normal LAP. Right ventricle cavity is normal in size. Mild hypertrophy of the right ventricle. Normal right ventricular function. Mild tricuspid regurgitation. No evidence of pulmonary hypertension. Compared to study 03/01/2014 G1DD and LV filling pressures are now normal otherwise no significant change.    PCV MYOCARDIAL PERFUSION WITH LEXISCAN 05/02/2021 . 1 Day Rest/Stress Protocol. Stress EKG is non-diagnostic for ischemia as it's a pharmacologic stress test using Lexiscan. Normal myocardial perfusion, without convincing evidence of reversible myocardial ischemia or prior infarct. Left ventricular size normal, wall thickness preserved, no regional wall motion abnormalities. Calculated LVEF 65%. No prior studies available for comparison. Low risk study.    Carotid artery duplex 05/02/2021: Duplex suggests stenosis in the right internal carotid artery (minimal). Duplex suggests stenosis in the left internal carotid artery (minimal). There is mild  mostly homogeneous plaque in the bilateral carotid arteries and heterogeneous plaque in the bulb.  Antegrade right vertebral artery flow. Antegrade left vertebral artery flow.  EKG:   EKG 12/10/2021: Normal sinus rhythm at the rate of 75 bpm, biatrial enlargement, leftward axis.  Incomplete right bundle branch block.  Normal QT interval.  Compared to 03/30/2021, no significant change.  Assessment     ICD-10-CM   1. Pure hypercholesterolemia  E78.00     2. Arterial fibromuscular dysplasia ([facility]) Left carotid and possible bilateral renals  I77.3     3. Primary hypertension  I10 EKG 12-Lead       Medications Discontinued During This Encounter  Medication Reason   ergocalciferol (VITAMIN D2) 1.25 MG (50000 UT) capsule      No orders of the defined types were placed in this encounter.  Orders Placed This Encounter  Procedures   EKG 12-Lead    Recommendations:   Nicole Best is a 71 y.o.  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  She is presently asymptomatic.  She took retirement earlier this year.  Physical examination reveals normal cardiac and vascular examination.  I reviewed her external labs, lipids and excellent control, diabetes is now improved with lifestyle changes, blood pressure is under excellent control as well.  Advised her to continue present medications and not to change them and I will see her back on a as needed basis.  From primary/secondary prevention standpoint, she is on best medical therapy for now.      Adrian Prows, MD,  [facility] 12/10/2021, 1:01 PM Office: [phone]

## 2022-01-10 DIAGNOSIS — J029 Acute pharyngitis, unspecified: Secondary | ICD-10-CM | POA: Diagnosis not present

## 2022-01-10 DIAGNOSIS — Z6823 Body mass index (BMI) 23.0-23.9, adult: Secondary | ICD-10-CM | POA: Diagnosis not present

## 2022-01-10 DIAGNOSIS — I10 Essential (primary) hypertension: Secondary | ICD-10-CM | POA: Diagnosis not present

## 2022-02-28 DIAGNOSIS — E119 Type 2 diabetes mellitus without complications: Secondary | ICD-10-CM | POA: Diagnosis not present

## 2022-03-29 DIAGNOSIS — E119 Type 2 diabetes mellitus without complications: Secondary | ICD-10-CM | POA: Diagnosis not present

## 2022-04-03 DIAGNOSIS — M25552 Pain in left hip: Secondary | ICD-10-CM | POA: Diagnosis not present

## 2022-04-03 DIAGNOSIS — M25562 Pain in left knee: Secondary | ICD-10-CM | POA: Diagnosis not present

## 2022-05-22 DIAGNOSIS — I773 Arterial fibromuscular dysplasia: Secondary | ICD-10-CM | POA: Diagnosis not present

## 2022-05-22 DIAGNOSIS — I10 Essential (primary) hypertension: Secondary | ICD-10-CM | POA: Diagnosis not present

## 2022-05-22 DIAGNOSIS — E7800 Pure hypercholesterolemia, unspecified: Secondary | ICD-10-CM | POA: Diagnosis not present

## 2022-05-22 DIAGNOSIS — E1121 Type 2 diabetes mellitus with diabetic nephropathy: Secondary | ICD-10-CM | POA: Diagnosis not present

## 2022-05-22 DIAGNOSIS — E559 Vitamin D deficiency, unspecified: Secondary | ICD-10-CM | POA: Diagnosis not present

## 2022-05-22 DIAGNOSIS — E1165 Type 2 diabetes mellitus with hyperglycemia: Secondary | ICD-10-CM | POA: Diagnosis not present

## 2022-07-03 DIAGNOSIS — E1165 Type 2 diabetes mellitus with hyperglycemia: Secondary | ICD-10-CM | POA: Diagnosis not present

## 2022-07-03 DIAGNOSIS — R21 Rash and other nonspecific skin eruption: Secondary | ICD-10-CM | POA: Diagnosis not present

## 2023-01-17 DIAGNOSIS — I10 Essential (primary) hypertension: Secondary | ICD-10-CM | POA: Diagnosis not present

## 2023-01-17 DIAGNOSIS — E7800 Pure hypercholesterolemia, unspecified: Secondary | ICD-10-CM | POA: Diagnosis not present

## 2023-01-17 DIAGNOSIS — E1121 Type 2 diabetes mellitus with diabetic nephropathy: Secondary | ICD-10-CM | POA: Diagnosis not present

## 2023-01-17 DIAGNOSIS — E1165 Type 2 diabetes mellitus with hyperglycemia: Secondary | ICD-10-CM | POA: Diagnosis not present

## 2023-01-17 DIAGNOSIS — L508 Other urticaria: Secondary | ICD-10-CM | POA: Diagnosis not present

## 2023-06-24 ENCOUNTER — Other Ambulatory Visit: Payer: Self-pay | Admitting: Family Medicine

## 2023-06-24 DIAGNOSIS — Z1231 Encounter for screening mammogram for malignant neoplasm of breast: Secondary | ICD-10-CM

## 2023-07-03 ENCOUNTER — Encounter (HOSPITAL_COMMUNITY): Payer: Self-pay

## 2023-07-10 ENCOUNTER — Ambulatory Visit
Admission: RE | Admit: 2023-07-10 | Discharge: 2023-07-10 | Disposition: A | Discharge status: Home / Self Care | Source: Ambulatory Visit | Attending: Family Medicine | Admitting: Family Medicine

## 2023-07-10 DIAGNOSIS — Z1231 Encounter for screening mammogram for malignant neoplasm of breast: Secondary | ICD-10-CM | POA: Diagnosis not present

## 2023-07-16 ENCOUNTER — Other Ambulatory Visit: Payer: Self-pay | Admitting: Family Medicine

## 2023-07-16 DIAGNOSIS — R928 Other abnormal and inconclusive findings on diagnostic imaging of breast: Secondary | ICD-10-CM

## 2023-07-17 DIAGNOSIS — E1165 Type 2 diabetes mellitus with hyperglycemia: Secondary | ICD-10-CM | POA: Diagnosis not present

## 2023-07-17 DIAGNOSIS — Z1211 Encounter for screening for malignant neoplasm of colon: Secondary | ICD-10-CM | POA: Diagnosis not present

## 2023-07-17 DIAGNOSIS — I10 Essential (primary) hypertension: Secondary | ICD-10-CM | POA: Diagnosis not present

## 2023-07-17 DIAGNOSIS — Z23 Encounter for immunization: Secondary | ICD-10-CM | POA: Diagnosis not present

## 2023-07-17 DIAGNOSIS — Z0000 Encounter for general adult medical examination without abnormal findings: Secondary | ICD-10-CM | POA: Diagnosis not present

## 2023-07-17 DIAGNOSIS — E1121 Type 2 diabetes mellitus with diabetic nephropathy: Secondary | ICD-10-CM | POA: Diagnosis not present

## 2023-07-19 ENCOUNTER — Other Ambulatory Visit (HOSPITAL_BASED_OUTPATIENT_CLINIC_OR_DEPARTMENT_OTHER): Payer: Self-pay | Admitting: Family Medicine

## 2023-07-19 DIAGNOSIS — E2839 Other primary ovarian failure: Secondary | ICD-10-CM

## 2023-07-23 ENCOUNTER — Ambulatory Visit
Admission: RE | Admit: 2023-07-23 | Discharge: 2023-07-23 | Disposition: A | Discharge status: Home / Self Care | Source: Ambulatory Visit | Attending: Family Medicine | Admitting: Family Medicine

## 2023-07-23 DIAGNOSIS — N6315 Unspecified lump in the right breast, overlapping quadrants: Secondary | ICD-10-CM | POA: Diagnosis not present

## 2023-07-23 DIAGNOSIS — R928 Other abnormal and inconclusive findings on diagnostic imaging of breast: Secondary | ICD-10-CM

## 2023-07-24 IMAGING — MR MR ABDOMEN W/O CM
8 series · 48 of 48 positions shown · non-contrast
Comparison: Abdominal MRI-05/24/2002 (report only, no images though
described findings suggestive of right-sided FMD)

CLINICAL DATA: Evaluate for renal artery stenosis.

EXAM:
MRI ABDOMEN WITHOUT CONTRAST
TECHNIQUE: Multiplanar multisequence MR imaging was performed without the
administration of intravenous contrast.

[Series 3: T2 · coronal · 5.0mm · 1.56mm/px · 4 of 36 slices shown (1 of 2)]
[im 1/36]
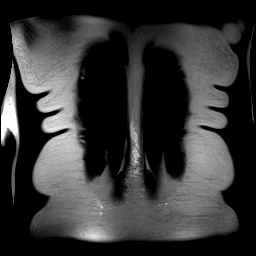
[im 12/36]
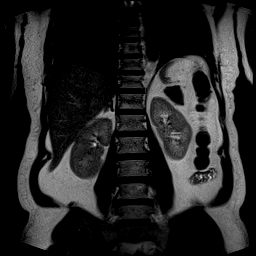
[im 24/36]
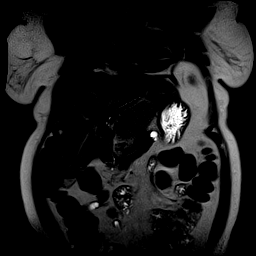
[im 36/36]
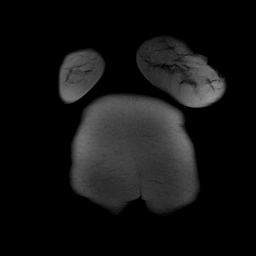

[Series 4: T1 · axial · 3.0mm · 1.32mm/px · z∈[-110,+127]mm · 15 of 160 slices shown]
[im 1/160]
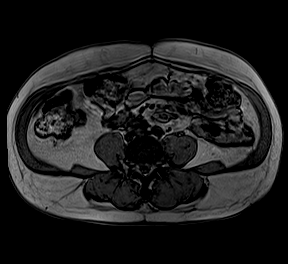
[im 12/160]
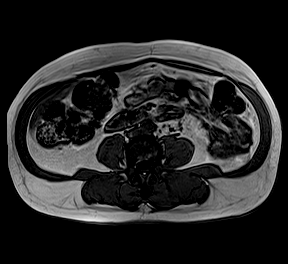
[im 23/160]
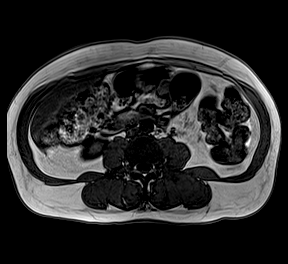
[im 35/160]
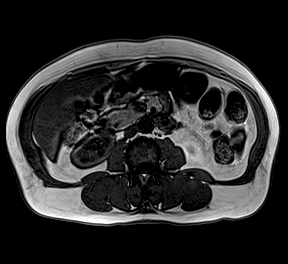
[im 46/160]
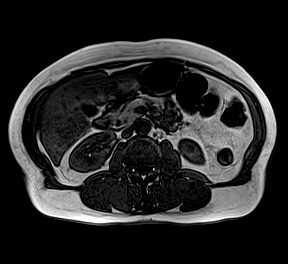
[im 57/160]
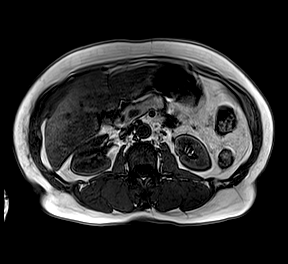
[im 69/160]
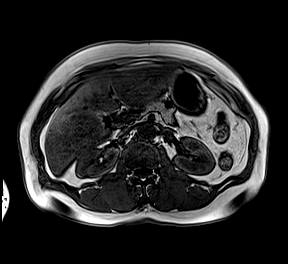
[im 80/160]
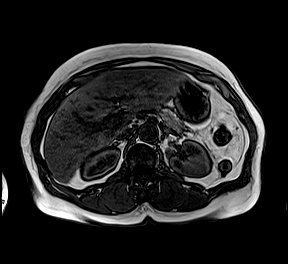
[im 91/160]
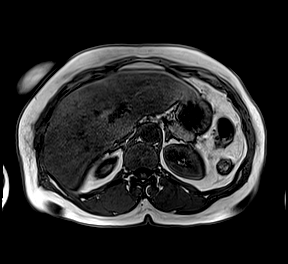
[im 103/160]
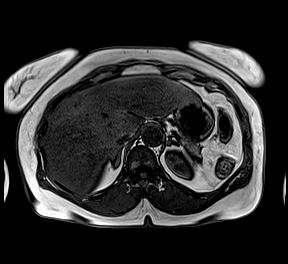
[im 114/160]
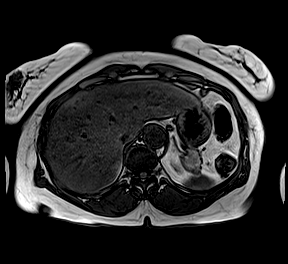
[im 125/160]
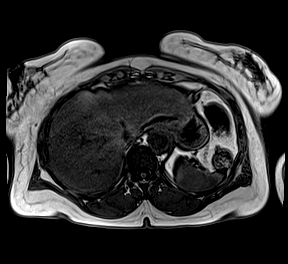
[im 137/160]
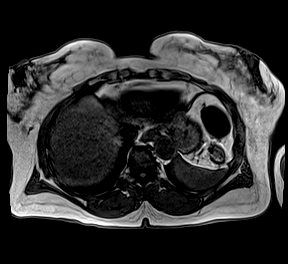
[im 148/160]
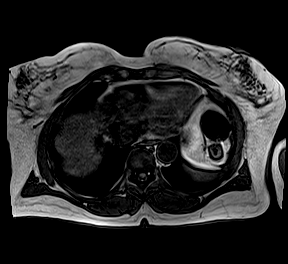
[im 160/160]
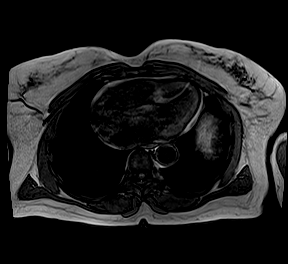

[Series 5: DWI · axial · 5.0mm · 1.61mm/px · z∈[-104,+118]mm · 10 of 114 slices shown (1 of 2)]
[im 1/114]
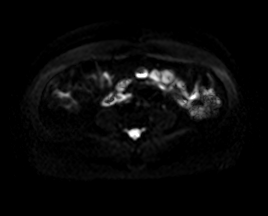
[im 13/114]
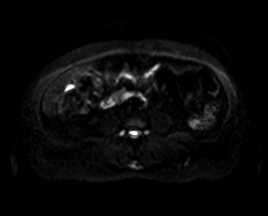
[im 26/114]
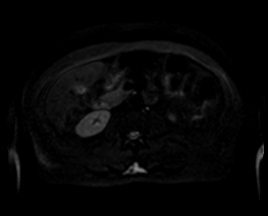
[im 38/114]
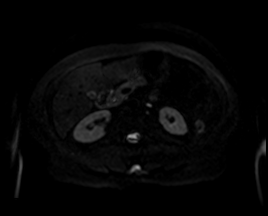
[im 51/114]
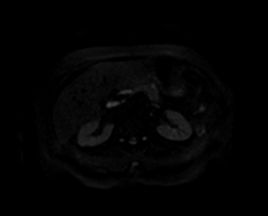
[im 63/114]
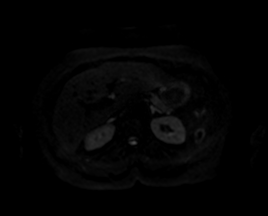
[im 76/114]
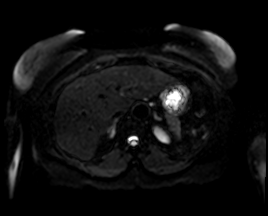
[im 88/114]
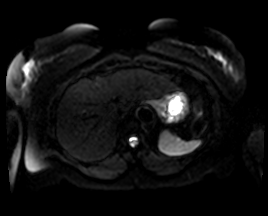
[im 101/114]
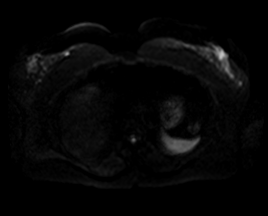
[im 114/114]
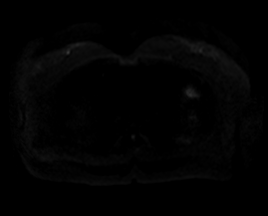

[Series 6: DWI · axial · 5.0mm · 1.61mm/px · z∈[-104,+118]mm · 3 of 38 slices shown (2 of 2)]
[im 1/38]
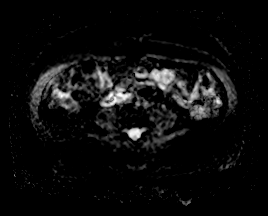
[im 19/38]
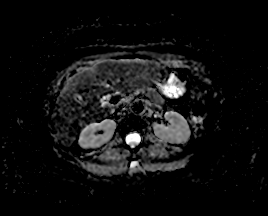
[im 38/38]
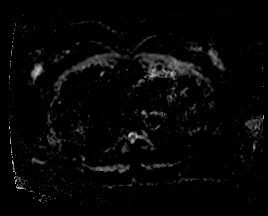

[Series 7: T2 · axial · 5.0mm · 1.48mm/px · z∈[-104,+118]mm · 3 of 38 slices shown (2 of 2)]
[im 1/38]
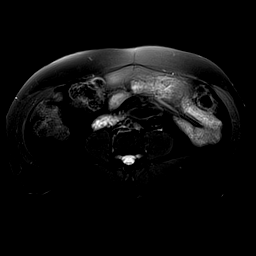
[im 19/38]
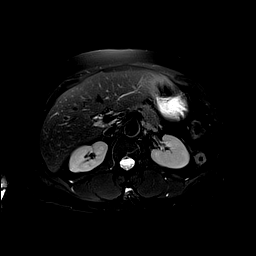
[im 38/38]
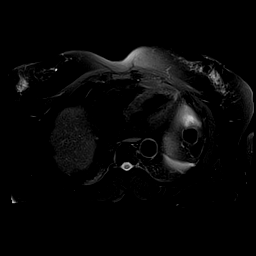

[Series 8: bSSFP · axial · 5.0mm · 1.67mm/px · z∈[-106,+122]mm · 4 of 39 slices shown (1 of 2)]
[im 1/39]
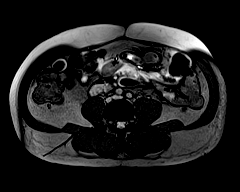
[im 13/39]
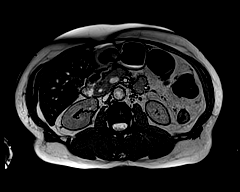
[im 26/39]
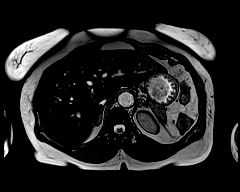
[im 39/39]
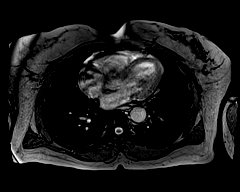

[Series 9: T1 dynamic · coronal · 3.0mm · 1.25mm/px · 6 of 64 slices shown]
[im 1/64]
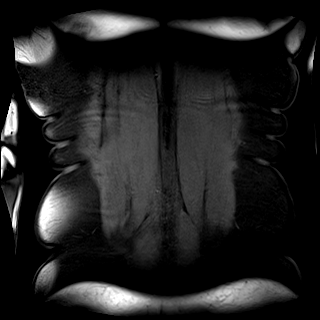
[im 13/64]
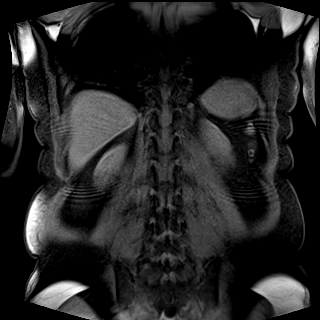
[im 26/64]
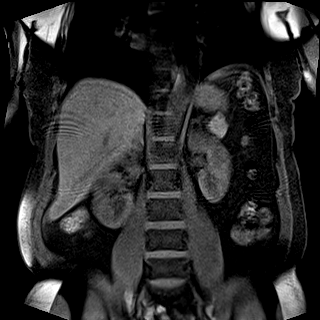
[im 38/64]
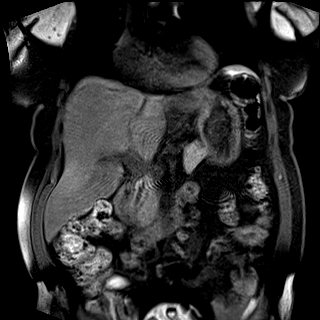
[im 51/64]
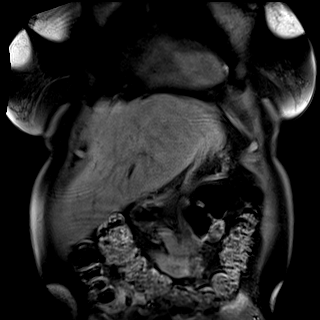
[im 64/64]
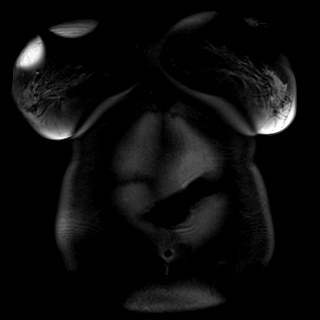

[Series 10: bSSFP · coronal · 5.0mm · 1.67mm/px · 3 of 33 slices shown (2 of 2)]
[im 1/33]
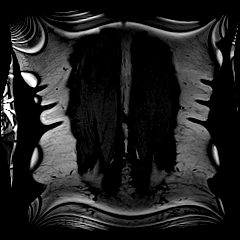
[im 17/33]
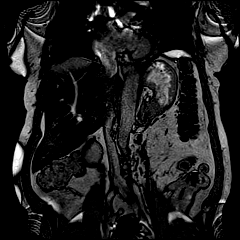
[im 33/33]
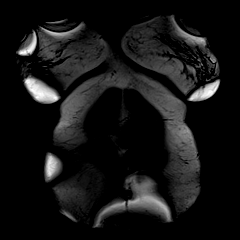

[48 of 48 positions shown; findings below may reference images not displayed]

FINDINGS: Lower chest: Cardiomegaly. No focal airspace opacities. No pleural
effusion.

Hepatobiliary: Normal hepatic contour. No discrete hepatic lesions.
Gallstones are seen within a decompressed but otherwise
normal-appearing gallbladder. No intra or extrahepatic biliary duct
dilatation.

Pancreas: Note is made of an approximately 1.1 cm T2 intense lesion
involving the anterior mid body of the pancreas (image 21, series
7), as well as an adjacent punctate (approximately 0.5 cm) T2
intense lesion also within mid body of the pancreas, both of which
are incompletely characterized on this noncontrast examination
though are without associated downstream pancreatic ductal
dilatation or atrophy and thus favored to represent a side branch
IPMN.

Spleen:  Normal appearance of the spleen.

Adrenals/Urinary Tract: Subcentimeter T2 intense left-sided renal
lesions are too small to accurately characterize though favored to
represent renal cysts (coronal image 9, series 3). The bilateral
kidneys are symmetrically sized without geographic atrophy. No
urinary obstruction or perinephric stranding.

Normal appearance of the bilateral adrenal glands. The urinary
bladder was not imaged.

Stomach/Bowel: Scattered colonic diverticulosis without evidence
superimposed acute diverticulitis. No evidence of enteric
obstruction. No significant hiatal hernia.

Vascular/Lymphatic: Lack of intravenous contrast and or dedicated
time-of-flight imaging limits the ability to evaluate the abdominal
vasculature, particularly, the bilateral renal arteries. Scattered
atherosclerotic plaque within normal caliber abdominal aorta.

No bulky retroperitoneal or mesenteric adenopathy.

Other:  Regional soft tissues appear normal.

Musculoskeletal: No discrete acute or aggressive osseous lesions.
IMPRESSION: 1. Atherosclerotic plaque within normal caliber abdominal aorta.
2. Nondiagnostic evaluation of the bilateral renal arteries
secondary to lack of intravenous contrast or the acquisition
noncontrast time-of-flight images. If clinical concern persists for
renal artery stenosis and/or FMD, further evaluation with
acquisition contrast-enhanced renal MRI or (preferably) CTA of the
abdomen and pelvis as indicated.
3. Incidentally noted 1.1 cm T2 intense pancreatic lesion,
incompletely characterized on this noncontrast examination though
without associated downstream pancreatic atrophy or ductal
dilatation and thus favored to represent a benign pancreatic
side-branch IPMN.
4. Cardiomegaly. Further evaluation with cardiac echo could be
performed as indicated.
5. Cholelithiasis without evidence of cholecystitis.

## 2023-11-12 DIAGNOSIS — E119 Type 2 diabetes mellitus without complications: Secondary | ICD-10-CM | POA: Diagnosis not present
